# Patient Record
Sex: Female | Born: 2000 | Race: White | Hispanic: No | Smoking: Current every day smoker
Health system: Southern US, Community
[De-identification: ages and names within clinical notes are randomized; demographics above are authoritative.]

## PROBLEM LIST (undated history)

## (undated) DIAGNOSIS — F419 Anxiety disorder, unspecified: Secondary | ICD-10-CM

## (undated) DIAGNOSIS — F329 Major depressive disorder, single episode, unspecified: Secondary | ICD-10-CM

## (undated) DIAGNOSIS — H547 Unspecified visual loss: Secondary | ICD-10-CM

## (undated) DIAGNOSIS — Z87898 Personal history of other specified conditions: Secondary | ICD-10-CM

## (undated) DIAGNOSIS — F319 Bipolar disorder, unspecified: Secondary | ICD-10-CM

## (undated) DIAGNOSIS — F32A Depression, unspecified: Secondary | ICD-10-CM

## (undated) DIAGNOSIS — J302 Other seasonal allergic rhinitis: Secondary | ICD-10-CM

## (undated) DIAGNOSIS — J45909 Unspecified asthma, uncomplicated: Secondary | ICD-10-CM

## (undated) HISTORY — PX: OTHER SURGICAL HISTORY: SHX169

## (undated) HISTORY — DX: Anxiety disorder, unspecified: F41.9

## (undated) HISTORY — DX: Unspecified asthma, uncomplicated: J45.909

## (undated) HISTORY — DX: Depression, unspecified: F32.A

## (undated) HISTORY — DX: Unspecified visual loss: H54.7

## (undated) HISTORY — DX: Personal history of other specified conditions: Z87.898

---
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
## 2019-02-06 ENCOUNTER — Emergency Department
Admission: EM | Admit: 2019-02-06 | Discharge: 2019-02-06 | Disposition: A | Discharge status: Home / Self Care | Payer: Self-pay | Attending: Emergency Medicine | Admitting: Emergency Medicine

## 2019-02-06 ENCOUNTER — Other Ambulatory Visit: Payer: Self-pay

## 2019-02-06 ENCOUNTER — Encounter: Payer: Self-pay | Admitting: Emergency Medicine

## 2019-02-06 DIAGNOSIS — R300 Dysuria: Secondary | ICD-10-CM | POA: Insufficient documentation

## 2019-02-06 DIAGNOSIS — N898 Other specified noninflammatory disorders of vagina: Secondary | ICD-10-CM | POA: Insufficient documentation

## 2019-02-06 DIAGNOSIS — Z202 Contact with and (suspected) exposure to infections with a predominantly sexual mode of transmission: Secondary | ICD-10-CM | POA: Insufficient documentation

## 2019-02-06 DIAGNOSIS — F17200 Nicotine dependence, unspecified, uncomplicated: Secondary | ICD-10-CM | POA: Insufficient documentation

## 2019-02-06 MED ORDER — METRONIDAZOLE 500 MG PO TABS: 500.0000 mg | ORAL_TABLET | Freq: Two times a day (BID) | ORAL | 0 refills | Status: DC

## 2019-02-06 MED ORDER — CEFTRIAXONE SODIUM 250 MG IJ SOLR
250.0000 mg | INTRAMUSCULAR | Status: DC
  Administered 2019-02-06: 250 mg via INTRAMUSCULAR
  Filled 2019-02-06: qty 250

## 2019-02-06 MED ORDER — FLUCONAZOLE 50 MG PO TABS
150.0000 mg | ORAL_TABLET | Freq: Once | ORAL | Status: AC
  Administered 2019-02-06: 150 mg via ORAL
  Filled 2019-02-06: qty 3

## 2019-02-06 MED ORDER — AZITHROMYCIN 500 MG PO TABS
1000.0000 mg | ORAL_TABLET | Freq: Once | ORAL | Status: AC
  Administered 2019-02-06: 1000 mg via ORAL
  Filled 2019-02-06: qty 2

## 2019-02-06 MED ORDER — LIDOCAINE HCL (PF) 1 % IJ SOLN
INTRAMUSCULAR | Status: AC
  Filled 2019-02-06: qty 5

## 2019-02-06 MED ORDER — LIDOCAINE HCL (PF) 1 % IJ SOLN: 1.0000 mL | Freq: Once | INTRAMUSCULAR | Status: DC

## 2019-02-06 NOTE — ED Provider Notes (Signed)
[facility] Emergency Department Provider Note       Time seen: ----------------------------------------- 9:04 PM on 02/06/2019 -----------------------------------------   I have reviewed the triage vital signs and the nursing notes.  HISTORY   Chief Complaint Exposure to STD    HPI Linda Chapman is a 18 y.o. female with a history of seasonal allergy and STD who presents to the ED for vaginal discharge with some dysuria and vaginal itching.  Patient is convinced that she has an STD as she has had one in the past.  She denies fevers, chills or other complaints.  Past Medical History:  Diagnosis Date  . Seasonal allergies     There are no active problems to display for this patient.   History reviewed. No pertinent surgical history.  Allergies Patient has no known allergies.  Social History Social History   Tobacco Use  . Smoking status: Current Every Day Smoker  . Smokeless tobacco: Never Used  Substance Use Topics  . Alcohol use: No    Frequency: Never  . Drug use: Not on file   Review of Systems Constitutional: Negative for fever. Cardiovascular: Negative for chest pain. Respiratory: Negative for shortness of breath. Gastrointestinal: Negative for abdominal pain, vomiting and diarrhea. Genitourinary: Positive for dysuria, vaginal discharge Musculoskeletal: Negative for back pain. Skin: Negative for rash.  All systems negative/normal/unremarkable except as stated in the HPI  ____________________________________________   PHYSICAL EXAM:  VITAL SIGNS: ED Triage Vitals [02/06/19 1919]  Enc Vitals Group     BP 106/67     Pulse Rate 91     Resp 18     Temp 98.2 F (36.8 C)     Temp Source Oral     SpO2 99 %     Weight 122 lb (55.3 kg)     Height 5\' 3"  (1.6 m)     Head Circumference      Peak Flow      Pain Score 0     Pain Loc      Pain Edu?      Excl. in GC?     Constitutional: Alert and oriented. Well appearing  and in no distress. Cardiovascular: Normal rate, regular rhythm. No murmurs, rubs, or gallops. Respiratory: Normal respiratory effort without tachypnea nor retractions. Breath sounds are clear and equal bilaterally. No wheezes/rales/rhonchi. Gastrointestinal: Soft and nontender. Normal bowel sounds Musculoskeletal: Nontender with normal range of motion in extremities. No lower extremity tenderness nor edema. Neurologic:  Normal speech and language. No gross focal neurologic deficits are appreciated.  Skin:  Skin is warm, dry and intact. No rash noted. Psychiatric: Mood and affect are normal. Speech and behavior are normal.  ____________________________________________  ED COURSE:  As part of my medical decision making, I reviewed the following data within the electronic MEDICAL RECORD NUMBER History obtained from family if available, nursing notes, old chart and ekg, as well as notes from prior ED visits. Patient presented for vaginal discharge, we will assess with labs and imaging as indicated at this time.   Procedures ____________________________________________   DIFFERENTIAL DIAGNOSIS   STD, bacterial vaginosis, vaginitis  FINAL ASSESSMENT AND PLAN  Vaginal discharge   Plan: The patient had presented for likely STD.  Patient opted for treatment without any further testing.  She was given Rocephin, Zithromax and Diflucan.  She will be discharged home with a prescription for Flagyl.   Ulice Dash, MD    Note: This note was generated in part or whole with voice  recognition software. Voice recognition is usually quite accurate but there are transcription errors that can and very often do occur. I apologize for any typographical errors that were not detected and corrected.     Emily Filbert, MD 02/06/19 2105

## 2019-02-06 NOTE — ED Triage Notes (Signed)
Pt presents to ED via POV with fiance who is also requesting STD screening. Pt c/o white vaginal discharge and dysuria and vaginal itching.

## 2019-02-24 DIAGNOSIS — F1510 Other stimulant abuse, uncomplicated: Secondary | ICD-10-CM | POA: Insufficient documentation

## 2019-02-24 DIAGNOSIS — F1110 Opioid abuse, uncomplicated: Secondary | ICD-10-CM | POA: Insufficient documentation

## 2019-02-24 DIAGNOSIS — F1410 Cocaine abuse, uncomplicated: Secondary | ICD-10-CM | POA: Insufficient documentation

## 2019-02-24 LAB — HM HIV SCREENING LAB: HM HIV Screening: NEGATIVE

## 2019-02-24 LAB — HM HEPATITIS C SCREENING LAB: HM Hepatitis Screen: NEGATIVE

## 2019-06-07 ENCOUNTER — Encounter: Payer: Self-pay | Admitting: Emergency Medicine

## 2019-06-07 ENCOUNTER — Emergency Department: Payer: 59

## 2019-06-07 ENCOUNTER — Other Ambulatory Visit: Payer: Self-pay

## 2019-06-07 DIAGNOSIS — S5012XA Contusion of left forearm, initial encounter: Secondary | ICD-10-CM | POA: Insufficient documentation

## 2019-06-07 DIAGNOSIS — Y929 Unspecified place or not applicable: Secondary | ICD-10-CM | POA: Diagnosis not present

## 2019-06-07 DIAGNOSIS — W01198A Fall on same level from slipping, tripping and stumbling with subsequent striking against other object, initial encounter: Secondary | ICD-10-CM | POA: Insufficient documentation

## 2019-06-07 DIAGNOSIS — F17200 Nicotine dependence, unspecified, uncomplicated: Secondary | ICD-10-CM | POA: Diagnosis not present

## 2019-06-07 DIAGNOSIS — Y990 Civilian activity done for income or pay: Secondary | ICD-10-CM | POA: Insufficient documentation

## 2019-06-07 DIAGNOSIS — Y9389 Activity, other specified: Secondary | ICD-10-CM | POA: Diagnosis not present

## 2019-06-07 DIAGNOSIS — S59912A Unspecified injury of left forearm, initial encounter: Secondary | ICD-10-CM | POA: Diagnosis present

## 2019-06-07 NOTE — ED Triage Notes (Addendum)
Patient ambulatory to triage with steady gait, without difficulty or distress noted, mask in place; pt reports falling at work, today injuring left upper arm--c/o pain & swelling; st nexplanon removed earlier today; pt declines desire to file workers comp at this time

## 2019-06-08 ENCOUNTER — Emergency Department
Admission: EM | Admit: 2019-06-08 | Discharge: 2019-06-08 | Disposition: A | Discharge status: Home / Self Care | Payer: 59 | Attending: Emergency Medicine | Admitting: Emergency Medicine

## 2019-06-08 DIAGNOSIS — S40022A Contusion of left upper arm, initial encounter: Secondary | ICD-10-CM

## 2019-06-08 DIAGNOSIS — T1490XA Injury, unspecified, initial encounter: Secondary | ICD-10-CM

## 2019-06-08 NOTE — ED Provider Notes (Signed)
[facility] Emergency Department Provider Note  ____________________________________________   First MD Initiated Contact with Patient 06/08/19 0111     (approximate)  I have reviewed the triage vital signs and the nursing notes.   HISTORY  Chief Complaint Arm Injury    HPI Linda Chapman is a 18 y.o. female who presents for evaluation of pain in her left arm specifically on the inside of the elbow after a fall at work.  She had her Nexplanon removed from the same arm earlier in the day and then tripped over something while she was pushing some crates at work.  When she fell she hit the inside of her left arm (antecubital fossa) on the boxes.  She has some numbness in her hand and some swelling and bruising in the crook of her left elbow.  The pain is moderate and worse when she tries to flex and extend but there is no tenderness to the backs of the elbow and the swelling is relatively minimal.  She said that she has issues in both of her arms that if she flexes her elbows too tightly than she gets numbness in her hands so this is not altogether unexpected.  The Nexplanon wound did not reopen and she has had no bleeding.  She did not strike her head, did not lose consciousness, and has no headache nor neck pain.         Past Medical History:  Diagnosis Date  . Seasonal allergies     There are no active problems to display for this patient.   History reviewed. No pertinent surgical history.  Prior to Admission medications   Medication Sig Start Date End Date Taking? Authorizing Provider  cephALEXin (KEFLEX) 500 MG capsule Take 1 capsule (500 mg total) by mouth 2 (two) times daily. 11/12/17   Lurene ShadowPhelps, Erin O, PA-C  etonogestrel (NEXPLANON) 68 MG IMPL implant 1 each by Subdermal route once.    [provider]  metroNIDAZOLE (FLAGYL) 500 MG tablet Take 1 tablet (500 mg total) by mouth 3 (three) times daily. 09/18/18   Fisher, Roselyn BeringSusan W, PA-C   metroNIDAZOLE (FLAGYL) 500 MG tablet Take 1 tablet (500 mg total) by mouth 2 (two) times daily. 02/06/19   Emily FilbertWilliams, Jonathan E, MD    Allergies Patient has no known allergies.  Family History  Problem Relation Age of Onset  . Asthma Mother     Social History Social History   Tobacco Use  . Smoking status: Current Every Day Smoker  . Smokeless tobacco: Never Used  Substance Use Topics  . Alcohol use: No    Frequency: Never  . Drug use: Not on file    Review of Systems Constitutional: No fever/chills Respiratory: Denies shortness of breath. Gastrointestinal: No abdominal pain.  No nausea, no vomiting.   Musculoskeletal: Pain in inside of left elbow as described above.  Negative for neck pain.  Negative for back pain. Integumentary: Negative for rash. Neurological: Negative for headaches, focal weakness or numbness.   ____________________________________________   PHYSICAL EXAM:  VITAL SIGNS: ED Triage Vitals  Enc Vitals Group     BP 06/07/19 2132 (!) 132/100     Pulse Rate 06/07/19 2132 90     Resp 06/07/19 2132 18     Temp 06/07/19 2132 98.3 F (36.8 C)     Temp Source 06/07/19 2132 Oral     SpO2 06/07/19 2132 99 %     Weight 06/07/19 2135 55.8 kg (123 lb)  Height 06/07/19 2135 1.6 m (5\' 3" )     Head Circumference --      Peak Flow --      Pain Score 06/07/19 2132 8     Pain Loc --      Pain Edu? --      Excl. in [place]? --     Constitutional: Alert and oriented. Well appearing and in no acute distress. Eyes: Conjunctivae are normal.  Head: Atraumatic. Cardiovascular: Normal rate, regular rhythm. Good peripheral circulation.  Respiratory: Normal respiratory effort.  No retractions. No audible wheezing. Musculoskeletal: Patient has a bandage that she was told to leave in place for 24 hours at the site of the Nexplanon removal.  There is no obvious bleeding.  Compartments in the left arm are soft and easily compressible.  She has a contusion with some  ecchymosis in the left antecubital fossa consistent with a fall and contusion as she described.  There is some tenderness to palpation and a little bit of tenderness with flexion extension.  Neurovascularly intact distally although the patient does report some subjective sensory deficit (tingling) but it does not seem to be in a particular nerve distribution and she has a good and easily palpable radial pulse and good capillary refill.  No sign of compartment syndrome or other acute or emergent orthopedic or vascular condition. Neurologic:  Normal speech and language. No gross focal neurologic deficits are appreciated.  Skin:  Skin is warm, dry and intact. No rash noted.  Ecchymosis on left antecubital fossa as described above. Psychiatric: Mood and affect are normal. Speech and behavior are normal.  ____________________________________________   LABS (all labs ordered are listed, but only abnormal results are displayed)  Labs Reviewed - No data to display ____________________________________________  EKG  No indication for EKG ____________________________________________  RADIOLOGY I, Hinda Kehr, personally viewed and evaluated these images (plain radiographs) as part of my medical decision making, as well as reviewing the written report by the radiologist.  ED MD interpretation: No evidence of fracture nor dislocation  Official radiology report(s): Dg Humerus Left  Result Date: 06/07/2019 CLINICAL DATA:  Fall, left arm injury EXAM: LEFT HUMERUS - 2+ VIEW COMPARISON:  None. FINDINGS: There is no evidence of fracture or other focal bone lesions. Soft tissues are unremarkable. IMPRESSION: Negative. Electronically Signed   By: Rolm Baptise M.D.   On: 06/07/2019 22:11    ____________________________________________   PROCEDURES   Procedure(s) performed (including Critical Care):  Procedures   ____________________________________________   INITIAL IMPRESSION / MDM /  [place] / ED COURSE  As part of my medical decision making, I reviewed the following data within the [place] notes reviewed and incorporated, Old chart reviewed, Radiograph reviewed , Notes from prior ED visits and [place] Controlled Substance Database      *Alexias Margerum was evaluated in Emergency Department on 06/08/2019 for the symptoms described in the history of present illness. She was evaluated in the context of the global COVID-19 pandemic, which necessitated consideration that the patient might be at risk for infection with the SARS-CoV-2 virus that causes COVID-19. Institutional protocols and algorithms that pertain to the evaluation of patients at risk for COVID-19 are in a state of rapid change based on information released by regulatory bodies including the CDC and federal and state organizations. These policies and algorithms were followed during the patient's care in the ED.  Some ED evaluations and interventions may be delayed as a result of limited staffing  during the pandemic.*  Soft tissue contusion of the left antecubital fossa.  Her physical findings are consistent with the story she describes, where she fell forward and struck the inside of her left elbow on a crate.  No evidence of compartment syndrome or other acute or emergent medical condition.  I am providing a sling and a relatively loose ACE wrap and went over RICE recommendations.  I gave my usual and customary return precautions and she understands and agrees with the plan.      ____________________________________________  FINAL CLINICAL IMPRESSION(S) / ED DIAGNOSES  Final diagnoses:  Arm contusion, left, initial encounter     MEDICATIONS GIVEN DURING THIS VISIT:  Medications - No data to display   ED Discharge Orders    None       Note:  This document was prepared using Dragon voice recognition software and may include unintentional dictation errors.   Loleta RoseForbach,  Natasia Sanko, MD 06/08/19 [phone]

## 2019-08-01 ENCOUNTER — Other Ambulatory Visit: Payer: Self-pay

## 2019-08-01 ENCOUNTER — Emergency Department
Admission: EM | Admit: 2019-08-01 | Discharge: 2019-08-01 | Disposition: A | Discharge status: Home / Self Care | Payer: 59 | Attending: Emergency Medicine | Admitting: Emergency Medicine

## 2019-08-01 DIAGNOSIS — F17210 Nicotine dependence, cigarettes, uncomplicated: Secondary | ICD-10-CM | POA: Insufficient documentation

## 2019-08-01 DIAGNOSIS — Z23 Encounter for immunization: Secondary | ICD-10-CM | POA: Diagnosis not present

## 2019-08-01 DIAGNOSIS — Y9389 Activity, other specified: Secondary | ICD-10-CM | POA: Diagnosis not present

## 2019-08-01 DIAGNOSIS — Y998 Other external cause status: Secondary | ICD-10-CM | POA: Insufficient documentation

## 2019-08-01 DIAGNOSIS — S50812A Abrasion of left forearm, initial encounter: Secondary | ICD-10-CM | POA: Diagnosis not present

## 2019-08-01 DIAGNOSIS — Y9289 Other specified places as the place of occurrence of the external cause: Secondary | ICD-10-CM | POA: Insufficient documentation

## 2019-08-01 MED ORDER — TETANUS-DIPHTH-ACELL PERTUSSIS 5-2.5-18.5 LF-MCG/0.5 IM SUSP
0.5000 mL | Freq: Once | INTRAMUSCULAR | Status: AC
  Administered 2019-08-01: 0.5 mL via INTRAMUSCULAR
  Filled 2019-08-01: qty 0.5
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
## 2019-08-12 ENCOUNTER — Other Ambulatory Visit: Payer: Self-pay

## 2019-08-12 ENCOUNTER — Encounter: Payer: Self-pay | Admitting: Emergency Medicine

## 2019-08-12 ENCOUNTER — Emergency Department (EMERGENCY_DEPARTMENT_HOSPITAL)
Admission: EM | Admit: 2019-08-12 | Discharge: 2019-08-14 | Disposition: A | Discharge status: Other Acute Inpatient Hospital | Payer: 59 | Source: Home / Self Care | Attending: Student in an Organized Health Care Education/Training Program | Admitting: Student in an Organized Health Care Education/Training Program

## 2019-08-12 DIAGNOSIS — R45851 Suicidal ideations: Secondary | ICD-10-CM

## 2019-08-12 DIAGNOSIS — Z20828 Contact with and (suspected) exposure to other viral communicable diseases: Secondary | ICD-10-CM | POA: Insufficient documentation

## 2019-08-12 DIAGNOSIS — F319 Bipolar disorder, unspecified: Secondary | ICD-10-CM | POA: Insufficient documentation

## 2019-08-12 DIAGNOSIS — F332 Major depressive disorder, recurrent severe without psychotic features: Secondary | ICD-10-CM | POA: Diagnosis present

## 2019-08-12 DIAGNOSIS — T50902A Poisoning by unspecified drugs, medicaments and biological substances, intentional self-harm, initial encounter: Secondary | ICD-10-CM

## 2019-08-12 DIAGNOSIS — F17200 Nicotine dependence, unspecified, uncomplicated: Secondary | ICD-10-CM | POA: Insufficient documentation

## 2019-08-12 HISTORY — DX: Bipolar disorder, unspecified: F31.9

## 2019-08-12 LAB — URINALYSIS, COMPLETE (UACMP) WITH MICROSCOPIC
Bacteria, UA: NONE SEEN
Bilirubin Urine: NEGATIVE
Glucose, UA: NEGATIVE mg/dL
Hgb urine dipstick: NEGATIVE
Ketones, ur: NEGATIVE mg/dL
Leukocytes,Ua: NEGATIVE
Nitrite: NEGATIVE
Protein, ur: NEGATIVE mg/dL
Specific Gravity, Urine: 1.014 (ref 1.005–1.030)
WBC, UA: NONE SEEN WBC/hpf (ref 0–5)
pH: 6 (ref 5.0–8.0)

## 2019-08-12 LAB — COMPREHENSIVE METABOLIC PANEL
ALT: 15 U/L (ref 0–44)
AST: 17 U/L (ref 15–41)
Albumin: 4.6 g/dL (ref 3.5–5.0)
Alkaline Phosphatase: 64 U/L (ref 38–126)
Anion gap: 8 (ref 5–15)
BUN: 19 mg/dL (ref 6–20)
CO2: 23 mmol/L (ref 22–32)
Calcium: 9.3 mg/dL (ref 8.9–10.3)
Chloride: 109 mmol/L (ref 98–111)
Creatinine, Ser: 0.67 mg/dL (ref 0.44–1.00)
GFR calc Af Amer: >60 mL/min (ref >60)
GFR calc non Af Amer: >60 mL/min (ref >60)
Glucose, Bld: 111 mg/dL — ABNORMAL HIGH (ref 70–99)
Potassium: 3.4 mmol/L — ABNORMAL LOW (ref 3.5–5.1)
Sodium: 140 mmol/L (ref 135–145)
Total Bilirubin: 0.5 mg/dL (ref 0.3–1.2)
Total Protein: 7.5 g/dL (ref 6.5–8.1)

## 2019-08-12 LAB — CBC WITH DIFFERENTIAL/PLATELET
Abs Immature Granulocytes: 0.04 10*3/uL (ref 0.00–0.07)
Basophils Absolute: 0 10*3/uL (ref 0.0–0.1)
Basophils Relative: 0 %
Eosinophils Absolute: 0.1 10*3/uL (ref 0.0–0.5)
Eosinophils Relative: 1 %
HCT: 40.6 % (ref 36.0–46.0)
Hemoglobin: 13.9 g/dL (ref 12.0–15.0)
Immature Granulocytes: 0 %
Lymphocytes Relative: 17 %
Lymphs Abs: 1.9 10*3/uL (ref 0.7–4.0)
MCH: 31.6 pg (ref 26.0–34.0)
MCHC: 34.2 g/dL (ref 30.0–36.0)
MCV: 92.3 fL (ref 80.0–100.0)
Monocytes Absolute: 0.8 10*3/uL (ref 0.1–1.0)
Monocytes Relative: 7 %
Neutro Abs: 8.5 10*3/uL — ABNORMAL HIGH (ref 1.7–7.7)
Neutrophils Relative %: 75 %
Platelets: 254 10*3/uL (ref 150–400)
RBC: 4.4 MIL/uL (ref 3.87–5.11)
RDW: 11.7 % (ref 11.5–15.5)
WBC: 11.4 10*3/uL — ABNORMAL HIGH (ref 4.0–10.5)
nRBC: 0 % (ref 0.0–0.2)

## 2019-08-12 LAB — ACETAMINOPHEN LEVEL: Acetaminophen (Tylenol), Serum: <10 ug/mL — ABNORMAL LOW (ref 10–30)

## 2019-08-12 LAB — SALICYLATE LEVEL: Salicylate Lvl: <7 mg/dL (ref 2.8–30.0)

## 2019-08-12 LAB — POCT PREGNANCY, URINE: Preg Test, Ur: NEGATIVE

## 2019-08-12 LAB — HCG, QUANTITATIVE, PREGNANCY: hCG, Beta Chain, Quant, S: <1 m[IU]/mL (ref <5)

## 2019-08-12 MED ORDER — BACITRACIN ZINC 500 UNIT/GM EX OINT
TOPICAL_OINTMENT | Freq: Two times a day (BID) | CUTANEOUS | Status: DC
  Administered 2019-08-12: 20:00:00 via TOPICAL
  Filled 2019-08-12: qty 0.9

## 2019-08-12 NOTE — ED Notes (Signed)
Pt dressed out by this tech.  Blue underwear Blue jeans Black shirt Black socks Black shoes Blue Bra Black wallet Pink Cell Phone Black colorful belt Green Watch Navel ring 2 containers of pills
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
## 2019-08-12 NOTE — ED Notes (Signed)
Pt. Moved to 20 H from interview room.

## 2019-08-12 NOTE — ED Notes (Signed)
IVC pending consult   
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
## 2019-08-12 NOTE — ED Notes (Signed)
Pt. Talking to TTS in interview room.
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
## 2019-08-12 NOTE — ED Notes (Signed)
Pt's wound on her left forearm cleaned and bacitracin applied. The scratch is more of an abrasion and is about 6 inches long

## 2019-08-12 NOTE — ED Notes (Signed)
Pt states the bottles of prednisone and stool softners full. Pointed out the predisone was filled in September and only had 16 in the bottle. Pt unwilling to give mom's info to find out how many she took back in September.

## 2019-08-12 NOTE — ED Notes (Signed)
Pt. Introduced to unit.  Pt. Advised of cameras and safety checks.  Pt. Wanting to be woke up when Psychiatrist is available to due assessment.

## 2019-08-12 NOTE — ED Notes (Signed)
Attempted to call mother Arne Cleveland [phone]. No voicemail available, just rings with no answer.

## 2019-08-12 NOTE — ED Notes (Addendum)
Poison control -  Confirmed pt will only need supportive care. Possible gi upset from prednisone and possible diarrhea from stool softner. Repeat tylenol at 2145

## 2019-08-13 DIAGNOSIS — F332 Major depressive disorder, recurrent severe without psychotic features: Secondary | ICD-10-CM | POA: Diagnosis present

## 2019-08-13 LAB — URINE DRUG SCREEN, QUALITATIVE (ARMC ONLY)
Amphetamines, Ur Screen: NOT DETECTED
Barbiturates, Ur Screen: NOT DETECTED
Benzodiazepine, Ur Scrn: NOT DETECTED
Cannabinoid 50 Ng, Ur ~~LOC~~: POSITIVE — AB
Cocaine Metabolite,Ur ~~LOC~~: NOT DETECTED
MDMA (Ecstasy)Ur Screen: NOT DETECTED
Methadone Scn, Ur: NOT DETECTED
Opiate, Ur Screen: NOT DETECTED
Phencyclidine (PCP) Ur S: NOT DETECTED
Tricyclic, Ur Screen: NOT DETECTED

## 2019-08-13 LAB — SARS CORONAVIRUS 2 BY RT PCR (HOSPITAL ORDER, PERFORMED IN ~~LOC~~ HOSPITAL LAB): SARS Coronavirus 2: NEGATIVE

## 2019-08-13 NOTE — BH Assessment (Addendum)
Update 14:30 Patient will continue with previous placement at [facility] on Monday 08/14/2019 as there is no transportation available to [place] on 08/13/2019.   -- 13:59 [facility] is willing to accept the patient for today if transportation can be arranged.   Patient has been accepted to [facility].  Patient room assignment will take place upon arrival. Accepting physician is Dr. Hyman Bower.  Call report to [phone].  Representative was [employer].   ER Staff is aware of it:  LuAnn, ER Secretary  Dr. Quentin Cornwall, ER MD  Amy, Patient's Nurse  If no transportation can be arranged, then patient will continue with previous placement for Monday morning transportation to [facility].

## 2019-08-13 NOTE — ED Notes (Signed)
Hourly rounding reveals patient in day room. No complaints, stable, in no acute distress. Q15 minute rounds and monitoring via Security Cameras to continue. 

## 2019-08-13 NOTE — ED Notes (Signed)
Pt given urine cup to provide sample for UDS.

## 2019-08-13 NOTE — BH Assessment (Signed)
Patient has been accepted to [facility].  Patient will receive room assignment upon arrival. Accepting physician is Dr. Drinda Butts.  Call report to [phone].  Representative was [employer].   ER Staff is aware of it:  Ronnie, ER Secretary  Dr. Quentin Cornwall, ER MD  Amy, Patient's Nurse

## 2019-08-13 NOTE — ED Notes (Signed)
No transport available today as they are in [place] and not back in time

## 2019-08-13 NOTE — BH Assessment (Addendum)
Ebony Hail called from [facility] with willingness to accept patient to the young adult program for tomorrow at 9am. Accepting provider is Dr. Jonelle Sports. Call report to [phone]. [facility] [place], [place].  TTS explained to Ebony Hail that if another hospital is able to accept patient for today, then [facility] will accept the placement for today rather than wait for this placement tomorrow.  Ebony Hail expressed understanding.

## 2019-08-13 NOTE — BH Assessment (Signed)
[facility] ED Referral Information was faxed to the following:   [place]  [place]

## 2019-08-13 NOTE — ED Notes (Signed)
Poison control called back to check on patient, information given.

## 2019-08-13 NOTE — ED Notes (Signed)
SOC called, report given.  Pt. Ready.  Pt. Talking to [facility]

## 2019-08-13 NOTE — ED Notes (Signed)
Pt. Completed SOC.  Pt. Initially upset about SOC's recommendation to admit.  Spoke with patient after [facility] for awhile about possible benefits to inpatient services.  Pt. Became more receptive to inpatient, but is scared about the possibility of losing job.  Pt. Told she would be given work excuse from hospital.  Pt. Calm and more upbeat after conversation.

## 2019-08-13 NOTE — ED Notes (Signed)
Report to include Situation, Background, Assessment, and Recommendations received from Amy B. RN. Patient alert and oriented, warm and dry, in no acute distress. Patient denies SI, HI, AVH and pain. Patient made aware of Q15 minute rounds and security cameras for their safety. Patient instructed to come to me with needs or concerns. 
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
## 2019-08-14 ENCOUNTER — Encounter: Payer: Self-pay | Admitting: Psychiatry

## 2019-08-14 ENCOUNTER — Other Ambulatory Visit: Payer: Self-pay

## 2019-08-14 ENCOUNTER — Inpatient Hospital Stay
Admission: RE | Admit: 2019-08-14 | Discharge: 2019-08-18 | DRG: 885 | Disposition: A | Discharge status: Home / Self Care | Payer: 59 | Source: Intra-hospital | Attending: Psychiatry | Admitting: Psychiatry

## 2019-08-14 DIAGNOSIS — F3160 Bipolar disorder, current episode mixed, unspecified: Principal | ICD-10-CM

## 2019-08-14 DIAGNOSIS — F1210 Cannabis abuse, uncomplicated: Secondary | ICD-10-CM

## 2019-08-14 DIAGNOSIS — Z20828 Contact with and (suspected) exposure to other viral communicable diseases: Secondary | ICD-10-CM | POA: Diagnosis present

## 2019-08-14 DIAGNOSIS — F332 Major depressive disorder, recurrent severe without psychotic features: Secondary | ICD-10-CM | POA: Diagnosis not present

## 2019-08-14 DIAGNOSIS — T1491XA Suicide attempt, initial encounter: Secondary | ICD-10-CM

## 2019-08-14 DIAGNOSIS — F17210 Nicotine dependence, cigarettes, uncomplicated: Secondary | ICD-10-CM | POA: Diagnosis present

## 2019-08-14 DIAGNOSIS — F322 Major depressive disorder, single episode, severe without psychotic features: Secondary | ICD-10-CM | POA: Insufficient documentation

## 2019-08-14 MED ORDER — MAGNESIUM HYDROXIDE 400 MG/5ML PO SUSP: 30.0000 mL | Freq: Every day | ORAL | Status: DC | PRN

## 2019-08-14 MED ORDER — ALUM & MAG HYDROXIDE-SIMETH 200-200-20 MG/5ML PO SUSP: 30.0000 mL | ORAL | Status: DC | PRN

## 2019-08-14 MED ORDER — ACETAMINOPHEN 325 MG PO TABS: 650.0000 mg | ORAL_TABLET | Freq: Four times a day (QID) | ORAL | Status: DC | PRN

## 2019-08-14 NOTE — ED Notes (Signed)
Hourly rounding reveals patient sleeping in room. No complaints, stable, in no acute distress. Q15 minute rounds and monitoring via Security Cameras to continue. 

## 2019-08-14 NOTE — ED Notes (Signed)
Breakfast tray with drink was given. 

## 2019-08-14 NOTE — Plan of Care (Addendum)
Newly admitted. Adjusting well to the unit. In the milieu, appropriate and cooperative. No self harm behaviors.

## 2019-08-14 NOTE — BH Assessment (Signed)
Writer spoke with [facility] ([phone]) and he stated he didn't have information about the patient being accepted to their facility.   Writer made several attempts to speak with admission staff at [facility] [phone]) but was unable to reach anyone.  Writer spoke with Psych Nurse Practitioner, Waylan Boga, and patient will be admitted to [facility].  Writer updated ER Secretary Lattie Haw).

## 2019-08-14 NOTE — ED Notes (Signed)
Gave lunch tray with juice. 

## 2019-08-14 NOTE — Tx Team (Signed)
Initial Treatment Plan 08/14/2019 6:52 PM Linda Chapman QBV:[id number]    PATIENT STRESSORS: Loss of marriage (currently separated from her husband) Marital or family conflict Substance abuse   PATIENT STRENGTHS: Ability for insight Active sense of humor Average or above average intelligence Capable of independent living Communication skills Physical Health   PATIENT IDENTIFIED PROBLEMS: Depression   Anxiety  Substance use                 DISCHARGE CRITERIA:  Ability to meet basic life and health needs Improved stabilization in mood, thinking, and/or behavior Motivation to continue treatment in a less acute level of care Verbal commitment to aftercare and medication compliance  PRELIMINARY DISCHARGE PLAN: Outpatient therapy Participate in family therapy Return to previous living arrangement  PATIENT/FAMILY INVOLVEMENT: This treatment plan has been presented to and reviewed with the patient, Linda Chapman.  The patient has been given the opportunity to ask questions and make suggestions.  Ronelle Nigh, RN 08/14/2019, 6:52 PM

## 2019-08-14 NOTE — ED Notes (Signed)
BEHAVIORAL HEALTH ROUNDING Patient sleeping: No. Patient alert and oriented: yes Behavior appropriate: Yes.  ; If no, describe:  Nutrition and fluids offered: yes Toileting and hygiene offered: Yes  Sitter present: q15 minute observations and security camera monitoring Law enforcement present: Yes   ENVIRONMENTAL ASSESSMENT Potentially harmful objects out of patient reach: Yes.   Personal belongings secured: Yes.   Patient dressed in hospital provided attire only: Yes.   Plastic bags out of patient reach: Yes.   Patient care equipment (cords, cables, call bells, lines, and drains) shortened, removed, or accounted for: Yes.   Equipment and supplies removed from bottom of stretcher: Yes.   Potentially toxic materials out of patient reach: Yes.   Sharps container removed or out of patient reach: Yes.    

## 2019-08-14 NOTE — ED Notes (Signed)
Hourly rounding reveals patient in room. No complaints, stable, in no acute distress. Q15 minute rounds and monitoring via Security Cameras to continue. 

## 2019-08-14 NOTE — ED Notes (Signed)
Patient observed lying in bed with eyes closed  Even, unlabored respirations observed   NAD pt appears to be sleeping  I will continue to monitor along with every 15 minute visual observations and ongoing security camera monitoring    

## 2019-08-14 NOTE — ED Notes (Signed)
BEHAVIORAL HEALTH ROUNDING Patient sleeping: No. Patient alert and oriented: yes Behavior appropriate: Yes.  ; If no, describe:  Nutrition and fluids offered: yes Toileting and hygiene offered: Yes  Sitter present: q15 minute observations and security camera monitoring   

## 2019-08-14 NOTE — ED Notes (Signed)
ED BHU [place] Is the patient under IVC or is there intent for IVC: Yes.   Is the patient medically cleared: Yes.   Is there vacancy in the ED BHU: Yes.   Is the population mix appropriate for patient: Yes.   Is the patient awaiting placement in inpatient or outpatient setting: Yes.   Has the patient had a psychiatric consult: Yes.   Survey of unit performed for contraband, proper placement and condition of furniture, tampering with fixtures in bathroom, shower, and each patient room: Yes.  ; Findings:  APPEARANCE/BEHAVIOR Calm and cooperative NEURO ASSESSMENT Orientation: oriented x3  Denies pain Hallucinations: No.None noted (Hallucinations)  Denies  Speech: Normal Gait: normal RESPIRATORY ASSESSMENT Even  Unlabored respirations  CARDIOVASCULAR ASSESSMENT Pulses equal   regular rate  Skin warm and dry   GASTROINTESTINAL ASSESSMENT no GI complaint EXTREMITIES Full ROM  PLAN OF CARE Provide calm/safe environment. Vital signs assessed twice daily. ED BHU Assessment once each 12-hour shift. Collaborate with TTS daily or as condition indicates. Assure the ED provider has rounded once each shift. Provide and encourage hygiene. Provide redirection as needed. Assess for escalating behavior; address immediately and inform ED provider.  Assess family dynamic and appropriateness for visitation as needed: Yes.  ; If necessary, describe findings:  Educate the patient/family about BHU procedures/visitation: Yes.  ; If necessary, describe findings:

## 2019-08-14 NOTE — BH Assessment (Signed)
Patient is to be admitted to [facility] by Psychiatric Nurse Practitioner Waylan Boga.  Attending Physician will be Dr. Weber Cooks.   Patient has been assigned to room 303, by [facility] Charge Nurse Demetria.   ER staff is aware of the admission:  Lattie Haw, ER Secretary    Dr. Charna Archer, ER MD   Amy T., Patient's Nurse   Gust Rung., Patient Access.

## 2019-08-14 NOTE — Progress Notes (Addendum)
Patient ID: Linda Chapman, female   DOB: Dec 10, 2001, 18 y.o.   MRN: [id number] Patient presents involuntarily secondary to increased depression, anxiety and suicidal thoughts that led to self harm behaviors. Reports that she got into an argument with her fiancee, became overwhelmed and started scratching her left forearm  "to ease my mental pain". Patient reports that she did not intend to kill herself. However, she also reports that she took an overdose on her mother's medications (Predindsone and laxatives, number unknown). Patient's fiancee became concerned and called for help.  Patient reports history of mental illnesses, notably Bipolar I disorder, MDD and Anxiety disorder. Reports that she has not been able to get treatment because her parents were going through divorce. Patient reports that she is going through divorce from her previous husband who is a 18 Year old female. She was married at age 74. She is a daily smoker of Marijuana and Cigarette. Reports that she is not ready to quit. Denies history of abuse or neglect. Denies pain. Pleasant and cooperative upon approach. Skin assessment performed by this Probation officer, staffed with nurse Demetria. Patient has scratched on her left forearm, related to self injurious behaviors. No sign of infection noted.  Admitted and oriented to the unit. Safety precautions initiated. To be evaluated by attending MD in AM.

## 2019-08-14 NOTE — ED Notes (Signed)
BEHAVIORAL HEALTH ROUNDING Patient sleeping: Yes.   Patient alert and oriented: eyes closed  Appears asleep Behavior appropriate: Yes.  ; If no, describe:  Nutrition and fluids offered: Yes  Toileting and hygiene offered: sleeping Sitter present: q 15 minute observations and security camera monitoring  

## 2019-08-14 NOTE — ED Provider Notes (Signed)
-----------------------------------------   3:02 AM on 08/14/2019 -----------------------------------------   Blood pressure (!) 97/54, pulse 81, temperature 98.1 F (36.7 C), temperature source Oral, resp. rate 16, height 5\' 3"  (1.6 m), weight 54.4 kg, last menstrual period 07/29/2019, SpO2 100 %.  The patient is calm and cooperative at this time.  There have been no acute events since the last update.  Awaiting disposition plan from Behavioral Medicine and/or Social Work team(s).    Delman Kitten, MD 08/14/19 [phone]

## 2019-08-14 NOTE — ED Notes (Signed)
BEHAVIORAL HEALTH ROUNDING Patient sleeping: No. Patient alert and oriented: yes Behavior appropriate: Yes.  ; If no, describe:  Nutrition and fluids offered: yes Toileting and hygiene offered: Yes  Sitter present: q15 minute observations and security camera monitoring Law enforcement present: Yes

## 2019-08-15 DIAGNOSIS — F3160 Bipolar disorder, current episode mixed, unspecified: Principal | ICD-10-CM

## 2019-08-15 DIAGNOSIS — F1210 Cannabis abuse, uncomplicated: Secondary | ICD-10-CM

## 2019-08-15 DIAGNOSIS — T1491XA Suicide attempt, initial encounter: Secondary | ICD-10-CM

## 2019-08-15 MED ORDER — QUETIAPINE FUMARATE 100 MG PO TABS
100.0000 mg | ORAL_TABLET | Freq: Every day | ORAL | Status: DC
  Administered 2019-08-15: 21:00:00 100 mg via ORAL
  Filled 2019-08-15: qty 1

## 2019-08-15 NOTE — [facility] Suicide Risk Assessment (Signed)
[facility] Admission Suicide Risk Assessment   Nursing information obtained from:  Patient Demographic factors:  Caucasian Current Mental Status:  NA Loss Factors:  NA Historical Factors:  NA Risk Reduction Factors:  Positive social support, Living with another person, especially a relative  Total Time spent with patient: 1 hour Principal Problem: Mixed bipolar I disorder ([place]) Diagnosis:  Principal Problem:   Mixed bipolar I disorder ([place]) Active Problems:   Cannabis abuse   Suicide attempt ([place])  Subjective Data: Patient seen chart reviewed.  Patient presented to the emergency room after an impulsive overdose with nonlethal medication.  Denies having had a clear-cut major depression recently but does have serious chronic problems with mood instability.  Not evidently psychotic.  Denies any current suicidal ideation.  Admits to daily marijuana use denies other drug use.  Patient is agreeable to treatment planning  Continued Clinical Symptoms:  Alcohol Use Disorder Identification Test Final Score (AUDIT): 2 The "Alcohol Use Disorders Identification Test", Guidelines for Use in Primary Care, Second Edition.  World Pharmacologist [facility]). Score between 0-7:  no or low risk or alcohol related problems. Score between 8-15:  moderate risk of alcohol related problems. Score between 16-19:  high risk of alcohol related problems. Score 20 or above:  warrants further diagnostic evaluation for alcohol dependence and treatment.   CLINICAL FACTORS:   Bipolar Disorder:   Mixed State Depression:   Impulsivity   Musculoskeletal: Strength & Muscle Tone: within normal limits Gait & Station: normal Patient leans: N/A  Psychiatric Specialty Exam: Physical Exam  Nursing note and vitals reviewed. Constitutional: She appears well-developed and well-nourished.  HENT:  Head: Normocephalic and atraumatic.  Eyes: Pupils are equal, round, and reactive to light. Conjunctivae are normal.  Neck: Normal  range of motion.  Cardiovascular: Regular rhythm and normal heart sounds.  Respiratory: Effort normal.  GI: Soft.  Musculoskeletal: Normal range of motion.  Neurological: She is alert.  Skin: Skin is warm and dry.  Psychiatric: Her mood appears anxious. Her affect is labile and inappropriate. Her speech is rapid and/or pressured. She is agitated and hyperactive. She is not aggressive and not combative. Cognition and memory are impaired. She expresses impulsivity. She expresses no homicidal and no suicidal ideation.    Review of Systems  Constitutional: Negative.   HENT: Negative.   Eyes: Negative.   Respiratory: Negative.   Cardiovascular: Negative.   Gastrointestinal: Negative.   Musculoskeletal: Negative.   Skin: Negative.   Neurological: Negative.   Psychiatric/Behavioral: Positive for depression, substance abuse and suicidal ideas. Negative for hallucinations and memory loss. The patient is nervous/anxious and has insomnia.     Blood pressure (!) 117/58, pulse 67, temperature 97.6 F (36.4 C), temperature source Oral, resp. rate 16, height 5\' 5"  (1.651 m), weight 53 kg, last menstrual period 07/29/2019, SpO2 100 %.Body mass index is 19.44 kg/m.  General Appearance: Casual  Eye Contact:  Good  Speech:  Pressured  Volume:  Increased  Mood:  Depressed and Euphoric  Affect:  Inappropriate and Labile  Thought Process:  Disorganized  Orientation:  Full (Time, Place, and Person)  Thought Content:  Illogical, Rumination and Tangential  Suicidal Thoughts:  No  Homicidal Thoughts:  No  Memory:  Immediate;   Fair Recent;   Fair Remote;   Fair  Judgement:  Impaired  Insight:  Fair  Psychomotor Activity:  Restlessness  Concentration:  Concentration: Poor  Recall:  [employer] of Knowledge:  Fair  Language:  Fair  Akathisia:  No  Handed:  Right  AIMS (if indicated):     Assets:  Desire for Improvement Financial Resources/Insurance Housing Physical Health Resilience Social  Support  ADL's:  Intact  Cognition:  WNL  Sleep:  Number of Hours: 3      COGNITIVE FEATURES THAT CONTRIBUTE TO RISK:  Loss of executive function    SUICIDE RISK:   Mild:  Suicidal ideation of limited frequency, intensity, duration, and specificity.  There are no identifiable plans, no associated intent, mild dysphoria and related symptoms, good self-control (both objective and subjective assessment), few other risk factors, and identifiable protective factors, including available and accessible social support.  PLAN OF CARE: Patient admitted after impulsive suicide attempt but was cooperative with coming to the hospital and with treatment.  Currently denies suicidal ideation.  Not delusional but showing dramatic mood lability and instability suggesting still some risk of possible suicidal ideation.  Continue appropriate treatment with medication.  15-minute checks.  Treatment team evaluation and groups and individual therapy  I certify that inpatient services furnished can reasonably be expected to improve the patient's condition.   Mordecai RasmussenJohn Clapacs, MD 08/15/2019, 2:32 PM
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
## 2019-08-15 NOTE — Progress Notes (Signed)
D: Pt alert and oriented. Pt affect/mood appears as anxious/depressed.  Pt rates depression 0/10, hopelessness 0/10, and anxiety 4/10.Pt goal: "stay focused on my happiness, stay away from my depression and anxiety". Pt reports energy level as normal and concentration as being good. Pt reports sleep last night as being good. Pt did not receive medications for sleep. Pt denies experiencing  pain. Pt denies experiencing any SI/HI, or AVH at this time.   Pt reports experiencing separation anxiety r/t being away from her dog and fiance. Developing new coping skills and identifying triggers were discussed with the pt. Pt states that she has a difficult time identifying her triggers and just tries to stay busy working. Pt also states that she is afraid and weird-ed out by another pt on the unit and doesn't know what she did to upset the other pt. This pt states that she isn't sure but thinks she maybe out to get her.  Pt has been observed laughing and smiling with peers. Pt seem to have formed an attachment/particularly close bond with a specific pt on the hall.  A: Support and encouragement provided. Frequent verbal contact made. Routine safety checks conducted q15 minutes.   R: Pt verbally contracts for safety at this time. Pt complaint with  treatment plan. Pt interacts well with others on the unit. Pt remains safe at this time. Will continue to monitor.

## 2019-08-15 NOTE — Progress Notes (Signed)
Patient alert and oriented x 4, affect is blunted, mood is labile she denies SI/HI/AVH, appears restless noted walking back and forth on the unit very intrusive with multiple request at a time. Patient's thoughts are disorganized, speech is pressured, and tangential at times, she was able to follow simple commands and receptive to staff.  Patient is complaint with medication regimen, she is interacting appropriately with peers and staff no distress noted. 15 minutes safety checks maintained will continue to monitor.

## 2019-08-15 NOTE — Progress Notes (Signed)
[place] NOVEL CORONAVIRUS (COVID-19) DAILY CHECK-OFF SYMPTOMS - answer yes or no to each - every day NO YES  Have you had a fever in the past 24 hours?  . Fever (Temp > 37.80C / 100F) X   Have you had any of these symptoms in the past 24 hours? . New Cough .  Sore Throat  .  Shortness of Breath .  Difficulty Breathing .  Unexplained Body Aches   X   Have you had any one of these symptoms in the past 24 hours not related to allergies?   . Runny Nose .  Nasal Congestion .  Sneezing   X   If you have had runny nose, nasal congestion, sneezing in the past 24 hours, has it worsened?  X   EXPOSURES - check yes or no X   Have you traveled outside the state in the past 14 days?  X   Have you been in contact with someone with a confirmed diagnosis of COVID-19 or PUI in the past 14 days without wearing appropriate PPE?  X   Have you been living in the same home as a person with confirmed diagnosis of COVID-19 or a PUI (household contact)?    X   Have you been diagnosed with COVID-19?    X              What to do next: Answered NO to all: Answered YES to anything:   Proceed with unit schedule Follow the [facility] Inpatient Flowsheet.   

## 2019-08-15 NOTE — [facility] Suicide Risk Assessment (Signed)
[place] INPATIENT:  Family/Significant Other Suicide Prevention Education  Suicide Prevention Education:  Contact Attempts: Linda Chapman, fiance [phone] has been identified by the patient as the family member/significant other with whom the patient will be residing, and identified as the person(s) who will aid the patient in the event of a mental health crisis.  With written consent from the patient, two attempts were made to provide suicide prevention education, prior to and/or following the patient's discharge.  We were unsuccessful in providing suicide prevention education.  A suicide education pamphlet was given to the patient to share with family/significant other.  Date and time of first attempt: 08/15/19 at [place] left confidential VM Date and time of second attempt: 2nd attempt needed  Linda Chapman T Linda Chapman 08/15/2019, 11:01 AM
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
## 2019-08-15 NOTE — Progress Notes (Signed)
Recreation Therapy Notes  Date: 08/15/2019  Time: 9:30 am  Location: Craft room  Behavioral response: Appropriate   Intervention Topic: Goals   Discussion/Intervention:  Group content on today was focused on goals. Patients described what goals are and how they define goals. Individuals expressed how they go about setting goals and reaching them. The group identified how important goals are and if they make short term goals to reach long term goals. Patients described how many goals they work on at a time and what affects them not reaching their goal. Individuals described how much time they put into planning and obtaining their goals. The group participated in the intervention "My Goal Board" and made personal goal boards to help them achieve their goal. Clinical Observations/Feedback:  Patient came to group and explained that short term goals are things that can be done now and long term goals can be done later. Individual was social with peers and staff while participating in the intervention.  Samaiyah Howes LRT/CTRS         Josefa Syracuse 08/15/2019 11:38 AM

## 2019-08-16 MED ORDER — QUETIAPINE FUMARATE 200 MG PO TABS
200.0000 mg | ORAL_TABLET | Freq: Every day | ORAL | Status: DC
  Administered 2019-08-16: 22:00:00 200 mg via ORAL
  Filled 2019-08-16: qty 1
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
## 2019-08-16 NOTE — Progress Notes (Signed)
[facility] MD Progress Note  08/16/2019 5:37 PM Linda Chapman  MRN:  [id number] Subjective: Patient seen chart reviewed.  Patient who appears to have mixed bipolar disorder.  Started on low-dose Seroquel yesterday.  She attended treatment team today.  She was cooperative and pleasant.  She remains very fidgety somewhat hyperactive.  Does not appear to be psychotic.  Denies suicidal ideation.  Affect is still labile but she is not presenting a problem.  Seems to get along well with others on the ward.  Slept a little better last night.  No new complaints. Principal Problem: Mixed bipolar I disorder ([place]) Diagnosis: Principal Problem:   Mixed bipolar I disorder ([place]) Active Problems:   Cannabis abuse   Suicide attempt ([place])  Total Time spent with patient: 30 minutes  Past Psychiatric History: Patient has a history of mental health problems when she was much younger  Past Medical History:  Past Medical History:  Diagnosis Date  . Bipolar 1 disorder ([place])   . Seasonal allergies    History reviewed. No pertinent surgical history. Family History:  Family History  Problem Relation Age of Onset  . Asthma Mother    Family Psychiatric  History: See previous Social History:  Social History   Substance and Sexual Activity  Alcohol Use No  . Frequency: Never     Social History   Substance and Sexual Activity  Drug Use Yes  . Types: Marijuana   Comment: "just enough"    Social History   Socioeconomic History  . Marital status: Legally Separated    Spouse name: Not on file  . Number of children: Not on file  . Years of education: Not on file  . Highest education level: Not on file  Occupational History  . Not on file  Social Needs  . Financial resource strain: Not on file  . Food insecurity    Worry: Not on file    Inability: Not on file  . Transportation needs    Medical: Not on file    Non-medical: Not on file  Tobacco Use  . Smoking status: Current Every Day Smoker   Packs/day: 1.50    Years: 5.00    Pack years: 7.50    Types: Cigarettes  . Smokeless tobacco: Never Used  . Tobacco comment: Patient refused  Substance and Sexual Activity  . Alcohol use: No    Frequency: Never  . Drug use: Yes    Types: Marijuana    Comment: "just enough"  . Sexual activity: Yes  Lifestyle  . Physical activity    Days per week: Not on file    Minutes per session: Not on file  . Stress: Not on file  Relationships  . Social Herbalist on phone: Not on file    Gets together: Not on file    Attends religious service: Not on file    Active member of club or organization: Not on file    Attends meetings of clubs or organizations: Not on file    Relationship status: Not on file  Other Topics Concern  . Not on file  Social History Narrative  . Not on file   Additional Social History:                         Sleep: Fair  Appetite:  Fair  Current Medications: Current Facility-Administered Medications  Medication Dose Route Frequency Provider Last Rate Last Dose  . acetaminophen (TYLENOL) tablet 650  mg  650 mg Oral Q6H PRN Charm Rings, NP      . alum & mag hydroxide-simeth (MAALOX/MYLANTA) 200-200-20 MG/5ML suspension 30 mL  30 mL Oral Q4H PRN Charm Rings, NP      . magnesium hydroxide (MILK OF MAGNESIA) suspension 30 mL  30 mL Oral Daily PRN Charm Rings, NP      . QUEtiapine (SEROQUEL) tablet 200 mg  200 mg Oral QHS Jhada Risk, Jackquline Denmark, MD        Lab Results: No results found for this or any previous visit (from the past 48 hour(s)).  Blood Alcohol level:  No results found for: [facility]  Metabolic Disorder Labs: No results found for: HGBA1C, MPG No results found for: PROLACTIN No results found for: CHOL, TRIG, HDL, CHOLHDL, VLDL, LDLCALC  Physical Findings: AIMS:  , ,  ,  ,    CIWA:    COWS:     Musculoskeletal: Strength & Muscle Tone: within normal limits Gait & Station: normal Patient leans: N/A  Psychiatric Specialty  Exam: Physical Exam  Nursing note and vitals reviewed. Constitutional: She appears well-developed and well-nourished.  HENT:  Head: Normocephalic and atraumatic.  Eyes: Pupils are equal, round, and reactive to light. Conjunctivae are normal.  Neck: Normal range of motion.  Cardiovascular: Regular rhythm and normal heart sounds.  Respiratory: Effort normal. No respiratory distress.  GI: Soft.  Musculoskeletal: Normal range of motion.  Neurological: She is alert.  Skin: Skin is warm and dry.  Psychiatric: Her affect is labile. Her speech is rapid and/or pressured. She is hyperactive. She is not agitated, not aggressive and not combative. Thought content is not paranoid. She expresses impulsivity. She expresses no homicidal and no suicidal ideation. She exhibits abnormal recent memory.    Review of Systems  Constitutional: Negative.   HENT: Negative.   Eyes: Negative.   Respiratory: Negative.   Cardiovascular: Negative.   Gastrointestinal: Negative.   Musculoskeletal: Negative.   Skin: Negative.   Neurological: Negative.   Psychiatric/Behavioral: Negative for depression, hallucinations, memory loss, substance abuse and suicidal ideas. The patient is nervous/anxious. The patient does not have insomnia.     Blood pressure 90/60, pulse 69, temperature 97.8 F (36.6 C), temperature source Oral, resp. rate 16, height 5\' 5"  (1.651 m), weight 53 kg, last menstrual period 07/29/2019, SpO2 99 %.Body mass index is 19.44 kg/m.  General Appearance: Casual  Eye Contact:  Fair  Speech:  Pressured  Volume:  Increased  Mood:  Anxious and Euphoric  Affect:  Labile  Thought Process:  Disorganized  Orientation:  Full (Time, Place, and Person)  Thought Content:  Illogical, Rumination and Tangential  Suicidal Thoughts:  No  Homicidal Thoughts:  No  Memory:  Immediate;   Fair Recent;   Fair Remote;   Fair  Judgement:  Fair  Insight:  Fair  Psychomotor Activity:  Increased  Concentration:   Concentration: Poor  Recall:  [employer] of Knowledge:  Fair  Language:  Fair  Akathisia:  No  Handed:  Right  AIMS (if indicated):     Assets:  Desire for Improvement Physical Health Resilience  ADL's:  Intact  Cognition:  Impaired,  Mild  Sleep:  Number of Hours: 6.75     Treatment Plan Summary: Daily contact with patient to assess and evaluate symptoms and progress in treatment, Medication management and Plan Started her on 100 mg of Seroquel for mixed bipolar disorder.  Patient still hyperactive a little agitated but not threatening  not tearful today better able to interact with others.  I am increasing the dose of the Seroquel to 200 mg tonight.  Likely discharge in a couple days.  Mordecai RasmussenJohn Tatym Schermer, MD 08/16/2019, 5:37 PM

## 2019-08-16 NOTE — Progress Notes (Signed)
Recreation Therapy Notes   Date: 08/16/2019  Time: 9:30 am  Location: Craft room  Behavioral response: Appropriate   Intervention Topic: Coping-Skills   Discussion/Intervention:  Group content on today was focused on coping skills. The group defined what coping skills are and when they can be used. Individuals described how they normally cope with thing and the coping skills they normally use. Patients expressed why it is important to cope with things and how not coping with things can affect you. The group participated in the intervention "My coping box" and made coping boxes while adding coping skills they could use in the future to the box. Clinical Observations/Feedback:  Patient came to group and defined coping skills as working out emotions and experiences. She explained that she walks and talks with family as her coping skills. Participant express that it is important to cope with things so that things are not bottled up. Individual was social with peers and staff while participating in the intervention.  Brittanie Dosanjh LRT/CTRS         Akeiba Axelson 08/16/2019 11:44 AM

## 2019-08-16 NOTE — [facility] Suicide Risk Assessment (Signed)
[place] INPATIENT:  Family/Significant Other Suicide Prevention Education  Suicide Prevention Education:  Contact Attempts: [name], fiance [phone] has been identified by the patient as the family member/significant other with whom the patient will be residing, and identified as the person(s) who will aid the patient in the event of a mental health crisis.  With written consent from the patient, two attempts were made to provide suicide prevention education, prior to and/or following the patient's discharge.  We were unsuccessful in providing suicide prevention education.  A suicide education pamphlet was given to the patient to share with family/significant other.  Date and time of first attempt: 08/15/19 Date and time of second attempt:08/16/19 at 1125am. CSW left confidential VM  Linda Chapman T Brittini Brubeck 08/16/2019, 11:28 AM

## 2019-08-16 NOTE — Progress Notes (Signed)
Patient stayed in the milieu and attended all the group activities. Pleasant and cooperative and compliant with treatment. Denying thoughts of self harm. Denying hallucinations. Had no major concern throughout the shift.

## 2019-08-16 NOTE — Tx Team (Addendum)
Interdisciplinary Treatment and Diagnostic Plan Update  08/16/2019 Time of Session: 2:30pm Linda Chapman MRN: [id number]  Principal Diagnosis: Mixed bipolar I disorder (HCC)  Secondary Diagnoses: Principal Problem:   Mixed bipolar I disorder (HCC) Active Problems:   Cannabis abuse   Suicide attempt (HCC)   Current Medications:  Current Facility-Administered Medications  Medication Dose Route Frequency Provider Last Rate Last Dose  . acetaminophen (TYLENOL) tablet 650 mg  650 mg Oral Q6H PRN Charm RingsLord, Jamison Y, NP      . alum & mag hydroxide-simeth (MAALOX/MYLANTA) 200-200-20 MG/5ML suspension 30 mL  30 mL Oral Q4H PRN Charm RingsLord, Jamison Y, NP      . magnesium hydroxide (MILK OF MAGNESIA) suspension 30 mL  30 mL Oral Daily PRN Charm RingsLord, Jamison Y, NP      . QUEtiapine (SEROQUEL) tablet 100 mg  100 mg Oral QHS Clapacs, Jackquline DenmarkJohn T, MD   100 mg at 08/15/19 2119   PTA Medications: Medications Prior to Admission  Medication Sig Dispense Refill Last Dose  . cephALEXin (KEFLEX) 500 MG capsule Take 1 capsule (500 mg total) by mouth 2 (two) times daily. (Patient not taking: Reported on 08/12/2019) 14 capsule 0   . etonogestrel (NEXPLANON) 68 MG IMPL implant 1 each by Subdermal route once.     . metroNIDAZOLE (FLAGYL) 500 MG tablet Take 1 tablet (500 mg total) by mouth 3 (three) times daily. (Patient not taking: Reported on 08/12/2019) 21 tablet 0   . metroNIDAZOLE (FLAGYL) 500 MG tablet Take 1 tablet (500 mg total) by mouth 2 (two) times daily. (Patient not taking: Reported on 08/12/2019) 14 tablet 0     Patient Stressors: Loss of marriage (currently separated from her husband) Marital or family conflict Substance abuse  Patient Strengths: Ability for insight Active sense of humor Average or above average intelligence Capable of independent living Communication skills Physical Health  Treatment Modalities: Medication Management, Group therapy, Case management,  1 to 1 session with clinician,  Psychoeducation, Recreational therapy.   Physician Treatment Plan for Primary Diagnosis: Mixed bipolar I disorder (HCC) Long Term Goal(s): Improvement in symptoms so as ready for discharge Improvement in symptoms so as ready for discharge   Short Term Goals: Ability to verbalize feelings will improve Ability to disclose and discuss suicidal ideas Ability to demonstrate self-control will improve Compliance with prescribed medications will improve  Medication Management: Evaluate patient's response, side effects, and tolerance of medication regimen.  Therapeutic Interventions: 1 to 1 sessions, Unit Group sessions and Medication administration.  Evaluation of Outcomes: Progressing  Physician Treatment Plan for Secondary Diagnosis: Principal Problem:   Mixed bipolar I disorder (HCC) Active Problems:   Cannabis abuse   Suicide attempt (HCC)  Long Term Goal(s): Improvement in symptoms so as ready for discharge Improvement in symptoms so as ready for discharge   Short Term Goals: Ability to verbalize feelings will improve Ability to disclose and discuss suicidal ideas Ability to demonstrate self-control will improve Compliance with prescribed medications will improve     Medication Management: Evaluate patient's response, side effects, and tolerance of medication regimen.  Therapeutic Interventions: 1 to 1 sessions, Unit Group sessions and Medication administration.  Evaluation of Outcomes: Progressing   RN Treatment Plan for Primary Diagnosis: Mixed bipolar I disorder (HCC) Long Term Goal(s): Knowledge of disease and therapeutic regimen to maintain health will improve  Short Term Goals: Ability to verbalize frustration and anger appropriately will improve, Ability to demonstrate self-control, Ability to participate in decision making will improve, Ability to verbalize feelings  will improve, Ability to disclose and discuss suicidal ideas and Ability to identify and develop  effective coping behaviors will improve  Medication Management: RN will administer medications as ordered by provider, will assess and evaluate patient's response and provide education to patient for prescribed medication. RN will report any adverse and/or side effects to prescribing provider.  Therapeutic Interventions: 1 on 1 counseling sessions, Psychoeducation, Medication administration, Evaluate responses to treatment, Monitor vital signs and CBGs as ordered, Perform/monitor CIWA, COWS, AIMS and Fall Risk screenings as ordered, Perform wound care treatments as ordered.  Evaluation of Outcomes: Progressing   LCSW Treatment Plan for Primary Diagnosis: Mixed bipolar I disorder ([place]) Long Term Goal(s): Safe transition to appropriate next level of care at discharge, Engage patient in therapeutic group addressing interpersonal concerns.  Short Term Goals: Engage patient in aftercare planning with referrals and resources, Increase social support, Increase ability to appropriately verbalize feelings, Increase emotional regulation and Facilitate acceptance of mental health diagnosis and concerns  Therapeutic Interventions: Assess for all discharge needs, 1 to 1 time with Social worker, Explore available resources and support systems, Assess for adequacy in community support network, Educate family and significant other(s) on suicide prevention, Complete Psychosocial Assessment, Interpersonal group therapy.  Evaluation of Outcomes: Progressing   Progress in Treatment: Attending groups: Yes. Participating in groups: Yes. Taking medication as prescribed: Yes. Toleration medication: Yes. Family/Significant other contact made: Yes, individual(s) contacted:  attempts have been made to contact patient's fiance.  Patient understands diagnosis: Yes. Discussing patient identified problems/goals with staff: Yes. Medical problems stabilized or resolved: Yes. Denies suicidal/homicidal ideation:  Yes. Issues/concerns per patient self-inventory: No. Other: none  New problem(s) identified: No, Describe:  none  New Short Term/Long Term Goal(s):  medication management for mood stabilization; elimination of SI thoughts; development of comprehensive mental wellness plan.  Patient Goals:  "find out what's really going to help me"  Discharge Plan or Barriers: Patient reports plans to attend aftercare with [facility] on 08/23/2019 and returning to her home.   Reason for Continuation of Hospitalization: Aggression Anxiety Depression Medication stabilization Suicidal ideation  Estimated Length of Stay: 1-7 days  Recreational Therapy: Patient Stressors: N/A  Patient Goal: Patient will engage in groups without prompting or encouragement from LRT x3 group sessions within 5 recreation therapy group sessions  Attendees: Patient: Linda Chapman 08/16/2019 3:23 PM  Physician: Dr. Weber Cooks, MD 08/16/2019 3:23 PM  Nursing:  08/16/2019 3:23 PM  RN Care Manager: 08/16/2019 3:23 PM  Social Worker: Assunta Curtis, LCSW 08/16/2019 3:23 PM  Recreational Therapist: Devin Going, LRT 08/16/2019 3:23 PM  Other: Sanjuana Kava, LCSW 08/16/2019 3:23 PM  Other:  08/16/2019 3:23 PM  Other: 08/16/2019 3:23 PM      Scribe for Treatment Team: Rozann Lesches, LCSW 08/16/2019 3:31 PM

## 2019-08-16 NOTE — Plan of Care (Signed)
Visible and active in the milieu. Cooperative and compliant with treatment.

## 2019-08-16 NOTE — BHH Group Notes (Signed)
LCSW Group Therapy Note  08/16/2019 1:00 PM  Type of Therapy/Topic:  Group Therapy:  Emotion Regulation  Participation Level:  Active   Description of Group:   The purpose of this group is to assist patients in learning to regulate negative emotions and experience positive emotions. Patients will be guided to discuss ways in which they have been vulnerable to their negative emotions. These vulnerabilities will be juxtaposed with experiences of positive emotions or situations, and patients will be challenged to use positive emotions to combat negative ones. Special emphasis will be placed on coping with negative emotions in conflict situations, and patients will process healthy conflict resolution skills.  Therapeutic Goals: 1. Patient will identify two positive emotions or experiences to reflect on in order to balance out negative emotions 2. Patient will label two or more emotions that they find the most difficult to experience 3. Patient will demonstrate positive conflict resolution skills through discussion and/or role plays  Summary of Patient Progress: Patient was present in group.  Patient was an active participant and supportive of other group members.  Patient willingly engaged in discussion on how to use coping skills effectively, identifying what has worked for her.  Patient discussed what happens when she is exposed to negative and positive emotions, respectively.  She discussed cheerleading statements to self and establishing boundaries and how that has been helpful to her.    Therapeutic Modalities:   Cognitive Behavioral Therapy Feelings Identification Dialectical Behavioral Therapy  Assunta Curtis, MSW, LCSW 08/16/2019 12:40 PM

## 2019-08-17 MED ORDER — QUETIAPINE FUMARATE 300 MG PO TABS
300.0000 mg | ORAL_TABLET | Freq: Every day | ORAL | Status: DC
  Administered 2019-08-17: 300 mg via ORAL
  Filled 2019-08-17: qty 1
  Filled 2019-08-17: qty 3
  Filled 2019-08-17: qty 1

## 2019-08-17 MED ORDER — QUETIAPINE FUMARATE 300 MG PO TABS: 300.0000 mg | ORAL_TABLET | Freq: Every day | ORAL | 0 refills | Status: DC

## 2019-08-17 NOTE — Progress Notes (Signed)
[facility]  08/17/2019 12:35 PM Linda LemmingsVictoria Chapman  MRN:  [id number] Subjective: Follow-up for this patient who came in with mood lability.  Patient continues to be easily distracted and a little hyperactive but she has been appropriate in her interactions with staff and peers.  Goes to groups appropriately.  Sleeping better.  Able to stay more less on topic during conversation.  No evidence of psychosis denies suicidal or homicidal ideation.  No new physical complaints Principal Problem: Mixed bipolar I disorder (HCC) Diagnosis: Principal Problem:   Mixed bipolar I disorder (HCC) Active Problems:   Cannabis abuse   Suicide attempt (HCC)  Total Time spent with patient: 30 minutes  Past Psychiatric History: History of mood instability and mood and behavior problems especially in youth  Past Medical History:  Past Medical History:  Diagnosis Date  . Bipolar 1 disorder (HCC)   . Seasonal allergies    History reviewed. No pertinent surgical history. Family History:  Family History  Problem Relation Age of Onset  . Asthma Mother    Family Psychiatric  History: See previous Social History:  Social History   Substance and Sexual Activity  Alcohol Use No  . Frequency: Never     Social History   Substance and Sexual Activity  Drug Use Yes  . Types: Marijuana   Comment: "just enough"    Social History   Socioeconomic History  . Marital status: Legally Separated    Spouse name: Not on file  . Number of children: Not on file  . Years of education: Not on file  . Highest education level: Not on file  Occupational History  . Not on file  Social Needs  . Financial resource strain: Not on file  . Food insecurity    Worry: Not on file    Inability: Not on file  . Transportation needs    Medical: Not on file    Non-medical: Not on file  Tobacco Use  . Smoking status: Current Every Day Smoker    Packs/day: 1.50    Years: 5.00    Pack years: 7.50    Types:  Cigarettes  . Smokeless tobacco: Never Used  . Tobacco comment: Patient refused  Substance and Sexual Activity  . Alcohol use: No    Frequency: Never  . Drug use: Yes    Types: Marijuana    Comment: "just enough"  . Sexual activity: Yes  Lifestyle  . Physical activity    Days per week: Not on file    Minutes per session: Not on file  . Stress: Not on file  Relationships  . Social Musicianconnections    Talks on phone: Not on file    Gets together: Not on file    Attends religious service: Not on file    Active member of club or organization: Not on file    Attends meetings of clubs or organizations: Not on file    Relationship status: Not on file  Other Topics Concern  . Not on file  Social History Narrative  . Not on file   Additional Social History:                         Sleep: Fair  Appetite:  Fair  Current Medications: Current Facility-Administered Medications  Medication Dose Route Frequency Provider Last Rate Last Dose  . acetaminophen (TYLENOL) tablet 650 mg  650 mg Oral Q6H PRN Charm RingsLord, Jamison Y, NP      .  alum & mag hydroxide-simeth (MAALOX/MYLANTA) 200-200-20 MG/5ML suspension 30 mL  30 mL Oral Q4H PRN Patrecia Pour, NP      . magnesium hydroxide (MILK OF MAGNESIA) suspension 30 mL  30 mL Oral Daily PRN Patrecia Pour, NP      . QUEtiapine (SEROQUEL) tablet 300 mg  300 mg Oral QHS Clapacs, John T, MD        Lab Results: No results found for this or any previous visit (from the past 48 hour(s)).  Blood Alcohol level:  No results found for: [facility]  Metabolic Disorder Labs: No results found for: HGBA1C, MPG No results found for: PROLACTIN No results found for: CHOL, TRIG, HDL, CHOLHDL, VLDL, LDLCALC  Physical Findings: AIMS:  , ,  ,  ,    CIWA:    COWS:     Musculoskeletal: Strength & Muscle Tone: within normal limits Gait & Station: normal Patient leans: N/A  Psychiatric Specialty Exam: Physical Exam  Nursing note and vitals  reviewed. Constitutional: She appears well-developed and well-nourished.  HENT:  Head: Normocephalic and atraumatic.  Eyes: Pupils are equal, round, and reactive to light. Conjunctivae are normal.  Neck: Normal range of motion.  Cardiovascular: Regular rhythm and normal heart sounds.  Respiratory: Effort normal.  GI: Soft.  Musculoskeletal: Normal range of motion.  Neurological: She is alert.  Skin: Skin is warm and dry.  Psychiatric: Judgment normal. Her affect is labile. Her speech is tangential. She is hyperactive. She is not agitated and not aggressive. Thought content is not paranoid. Cognition and memory are normal. She expresses no homicidal and no suicidal ideation.    Review of Systems  Constitutional: Negative.   HENT: Negative.   Eyes: Negative.   Respiratory: Negative.   Cardiovascular: Negative.   Gastrointestinal: Negative.   Musculoskeletal: Negative.   Skin: Negative.   Neurological: Negative.   Psychiatric/Behavioral: Negative.     Blood pressure 101/74, pulse 75, temperature 97.7 F (36.5 C), temperature source Oral, resp. rate 18, height 5\' 5"  (1.651 m), weight 53 kg, last menstrual period 07/29/2019, SpO2 100 %.Body mass index is 19.44 kg/m.  General Appearance: Casual  Eye Contact:  Good  Speech:  Pressured  Volume:  Normal  Mood:  Euthymic  Affect:  Congruent  Thought Process:  Goal Directed  Orientation:  Full (Time, Place, and Person)  Thought Content:  Logical  Suicidal Thoughts:  No  Homicidal Thoughts:  No  Memory:  Immediate;   Fair Recent;   Fair Remote;   Fair  Judgement:  Fair  Insight:  Fair  Psychomotor Activity:  Restlessness  Concentration:  Concentration: Fair  Recall:  [employer] of Knowledge:  Fair  Language:  Fair  Akathisia:  No  Handed:  Right  AIMS (if indicated):     Assets:  Desire for Improvement Housing Physical Health Resilience Social Support  ADL's:  Intact  Cognition:  WNL  Sleep:  Number of Hours: 6.5      Treatment Plan Summary: Daily contact with patient to assess and evaluate symptoms and progress in treatment, Medication management and Plan Patient has tolerated going up to 200 mg of Seroquel.  We will go up to 300 mg tonight night.  I explained this to her as being a good target dose for mood stability.  I think that she is doing well enough that we can anticipate likely discharge tomorrow.  She will have follow-up at [facility].  Psychoeducation and review of medicine done today.  Patient understanding  and agreeable to plan.  Mordecai Rasmussen, MD 08/17/2019, 12:35 PM

## 2019-08-17 NOTE — Plan of Care (Signed)
Patient is pleasant and cooperative on approach.Appropriate with staff & peers.Denies SI,HI and AVH.Patient stated that she is trying to develop a positive thinking.Attended groups.Appetite and energy level good.Support and encouragement given.

## 2019-08-17 NOTE — BHH Suicide Risk Assessment (Signed)
[facility] Discharge Suicide Risk Assessment   Principal Problem: Mixed bipolar I disorder [facility]) Discharge Diagnoses: Principal Problem:   Mixed bipolar I disorder ([place]) Active Problems:   Cannabis abuse   Suicide attempt ([place])   Total Time spent with patient: 30 minutes  Musculoskeletal: Strength & Muscle Tone: within normal limits Gait & Station: normal Patient leans: N/A  Psychiatric Specialty Exam: Review of Systems  Constitutional: Negative.   HENT: Negative.   Eyes: Negative.   Respiratory: Negative.   Cardiovascular: Negative.   Gastrointestinal: Negative.   Musculoskeletal: Negative.   Skin: Negative.   Neurological: Negative.   Psychiatric/Behavioral: Negative.     Blood pressure 101/74, pulse 75, temperature 97.7 F (36.5 C), temperature source Oral, resp. rate 18, height 5\' 5"  (1.651 m), weight 53 kg, last menstrual period 07/29/2019, SpO2 100 %.Body mass index is 19.44 kg/m.  General Appearance: Casual  Eye Contact::  Good  Speech:  Normal Rate409  Volume:  Normal  Mood:  Euthymic  Affect:  Congruent  Thought Process:  Goal Directed  Orientation:  Full (Time, Place, and Person)  Thought Content:  Logical  Suicidal Thoughts:  No  Homicidal Thoughts:  No  Memory:  Immediate;   Fair Recent;   Fair Remote;   Fair  Judgement:  Fair  Insight:  Fair  Psychomotor Activity:  Restlessness  Concentration:  Fair  Recall:  [employer] of Knowledge:Fair  Language: Fair  Akathisia:  No  Handed:  Right  AIMS (if indicated):     Assets:  Desire for Improvement Physical Health Resilience Social Support  Sleep:  Number of Hours: 6.5  Cognition: WNL  ADL's:  Intact   Mental Status Per Nursing Assessment::   On Admission:  NA  Demographic Factors:  Caucasian  Loss Factors: Financial problems/change in socioeconomic status  Historical Factors: Impulsivity  Risk Reduction Factors:   Employed, Living with another person, especially a relative, Positive  social support and Positive coping skills or problem solving skills  Continued Clinical Symptoms:  Bipolar Disorder:   Mixed State  Cognitive Features That Contribute To Risk:  None    Suicide Risk:  Minimal: No identifiable suicidal ideation.  Patients presenting with no risk factors but with morbid ruminations; may be classified as minimal risk based on the severity of the depressive symptoms  Follow-up Information    [facility] Follow up on 08/23/2019.   Why: You are scheduled with Lucious Groves for a telephone appointment on Wednesday, September 2nd at 10:00am.  Beverly Sessions will contact you at [phone]. Thank you. Contact information: [address] [place] [address] [phone]           Plan Of Care/Follow-up recommendations:  Activity:  Activity as tolerated Diet:  Regular diet Other:  Follow-up with [facility].  Continue current medicine.  Alethia Berthold, MD 08/17/2019, 12:56 PM

## 2019-08-17 NOTE — Progress Notes (Signed)
Recreation Therapy Notes   Date: 08/17/2019  Time: 9:30 am  Location: Craft room  Behavioral response: Appropriate   Intervention Topic: Happiness   Discussion/Intervention:  Group content today was focused on Happiness. The group defined happiness and described where happiness comes from. Individuals identified what makes them happy and how they go about making others happy. Patients expressed things that stop them from being happy and ways they can improve their happiness. The group stated reasons why it is important to be happy. The group participated in the intervention "My Happiness", where they had a chance to identify and express things that make them happy. Clinical Observations/Feedback:  Patient came to group and defined happiness as something that makes you feel enlightened and gives you hope. She stated that happiness comes from the brain. Participant explained that you can only make yourself happy. Patient described overthinking as a way she keeps herself from being happy. Individual was social with peers and staff while participating in the intervention.    Jadence Kinlaw LRT/CTRS         Laporsha Grealish 08/17/2019 11:09 AM

## 2019-08-17 NOTE — BHH Group Notes (Signed)
[facility] Group Notes:  (Nursing/MHT/Case Management/Adjunct)  Date:  08/17/2019  Time:  8:40 PM  Type of Therapy:  Group Therapy  Participation Level:  Active  Participation Quality:  Attentive  Affect:  Appropriate  Cognitive:  Alert  Insight:  Good  Engagement in Group:  Engaged    Modes of Intervention:  Support  Summary of Progress/Problems:  Linda Chapman 08/17/2019, 8:40 PM

## 2019-08-17 NOTE — Progress Notes (Signed)
Recreation Therapy Notes  INPATIENT RECREATION THERAPY ASSESSMENT  Patient Details Name: Linda Chapman MRN: [id number] DOB: August 10, 2001 Today's Date: 08/17/2019       Information Obtained From: Patient  Able to Participate in Assessment/Interview: Yes  Patient Presentation: Responsive  Reason for Admission (Per Patient): Active Symptoms, Suicidal Ideation, Suicide Attempt  Patient Stressors:    Coping Skills:   Self-Injury, Exercise, Substance Abuse, Talk, Prayer  Leisure Interests (2+):  Exercise - Walking, Art - Paint, Art - Coloring, Music - Listen  Frequency of Recreation/Participation: Weekly  Awareness of Community Resources:     [employer]:     Current Use:    If no, Barriers?:    Expressed Interest in [employer] Information:    [place] of Residence:  Insurance underwriter  Patient Main Form of Transportation: Musician  Patient Strengths:  Nice to others  Patient Identified Areas of Improvement:  coping skills  Patient Goal for Hospitalization:  Find what is really going to help me.  Current SI (including self-harm):  No  Current HI:  No  Current AVH: No  Staff Intervention Plan: Collaborate with Interdisciplinary Treatment Team, Group Attendance  Consent to Intern Participation: N/A  Linda Chapman 08/17/2019, 12:09 PM

## 2019-08-17 NOTE — BHH Group Notes (Signed)
LCSW Group Therapy Note  08/17/2019 12:26 PM  Type of Therapy/Topic:  Group Therapy:  Balance in Life  Participation Level:  Active  Description of Group:    This group will address the concept of balance and how it feels and looks when one is unbalanced. Patients will be encouraged to process areas in their lives that are out of balance and identify reasons for remaining unbalanced. Facilitators will guide patients in utilizing problem-solving interventions to address and correct the stressor making their life unbalanced. Understanding and applying boundaries will be explored and addressed for obtaining and maintaining a balanced life. Patients will be encouraged to explore ways to assertively make their unbalanced needs known to significant others in their lives, using other group members and facilitator for support and feedback.  Therapeutic Goals: 1. Patient will identify two or more emotions or situations they have that consume much of in their lives. 2. Patient will identify signs/triggers that life has become out of balance:  3. Patient will identify two ways to set boundaries in order to achieve balance in their lives:  4. Patient will demonstrate ability to communicate their needs through discussion and/or role plays  Summary of Patient Progress: Pt was appropriate and respectful in group. Pt was able to identify balance in her life as being "evenly proportioned and being at peace". Pt was reassuring with other group members and identified things that throw her off balance as trauma and drama. Pt reported in order to maintain balance, she can surround herself with positive people and positive energy.      Therapeutic Modalities:   Cognitive Behavioral Therapy Solution-Focused Therapy Assertiveness Training  Evalina Field, MSW, LCSW Clinical Social Work 08/17/2019 12:26 PM

## 2019-08-17 NOTE — Progress Notes (Signed)
Patient alert and oriented x 4, affect is blunted, mood is labile ,mood is congruent with affect denies SI/HI/AVH, appears less manic, speech is less pressured and she is not hyper active. Patient is interacting appropriately with peers, her thoughts is organized.  Patient is complaint with medication regimen, no distress noted. 15 minutes safety checks maintained will continue to monitor

## 2019-08-18 MED ORDER — QUETIAPINE FUMARATE 300 MG PO TABS: 300.0000 mg | ORAL_TABLET | Freq: Every day | ORAL | 2 refills | Status: DC

## 2019-08-18 NOTE — Progress Notes (Signed)
Patient alert and oriented x 4, affect is blunted, patient appears overly excited, mood is congruent with affectdenies SI/HI/AVH, appears less manic, speech is less pressured and non tangential she is interacting appropriately with peers, her thoughts are organized.she is receptive to nursing staff, and she attended evening wrap up group. Patient is complaint with medication regimen, no distress noted. 15 minutes safety checks maintained will continue to monitor

## 2019-08-18 NOTE — Progress Notes (Signed)
  [facility] Adult Case Management Discharge Plan :  Will you be returning to the same living situation after discharge:  Yes,  lives with significant other At discharge, do you have transportation home?: Yes,  significant other will pick up Do you have the ability to pay for your medications: Yes,  insurance  Release of information consent forms completed and in the chart;  Patient's signature needed at discharge.  Patient to Follow up at: Follow-up Information    [facility] Follow up on 08/23/2019.   Why: You are scheduled with Lucious Groves for a telephone appointment on Wednesday, September 2nd at 10:00am.  Beverly Sessions will contact you at [phone]. Thank you. Contact information: [address] [place] [address] [phone]           Next level of care provider has access to [facility] and Suicide Prevention discussed: Yes,  with pt; multiple attempts made to contact significant other but no response  Have you used any form of tobacco in the last 30 days? (Cigarettes, Smokeless Tobacco, Cigars, and/or Pipes): Yes  Has patient been referred to the Quitline?: N/A patient is not a smoker  Patient has been referred for addiction treatment: [place], LCSW 08/18/2019, 10:06 AM

## 2019-08-18 NOTE — Progress Notes (Signed)
Recreation Therapy Notes   Date: 08/18/2019  Time: 9:30 am  Location: Craft room  Behavioral response: Appropriate   Intervention Topic: Emotions   Discussion/Intervention:  Group content on today was focused on emotions. The group identified what emotions are and why it is important to have emotions. Patients expressed some positive and negative emotions. Individuals gave some past experiences on how they normally dealt with emotions in the past. The group described some positive ways to deal with emotions in the future. Patients participated in the intervention "Name the Linda Chapman" where individuals were given a chance to experience different emotions.  Clinical Observations/Feedback:  Patient came to group and defined emotions as feelings and different ways to express yourself. She stated that emotions come from the brain. Participant described situations where she has let others affect her emotions in a negative way. Individual was social with peers and staff while participating in the intervention.    Linda Chapman LRT/CTRS          Linda Chapman 08/18/2019 11:16 AM

## 2019-08-18 NOTE — Plan of Care (Signed)
  Problem: Education: Goal: Ability to state activities that reduce stress will improve Outcome: Progressing  Patient has ability to state and express activities that reduce stress will improve.

## 2019-08-18 NOTE — Progress Notes (Signed)
Patient denies SI/HI, denies A/V hallucinations. Patient verbalizes understanding of discharge instructions, follow up care and prescriptions.7 days medicine given to patient. Patient given all belongings from [facility] locker. Patient escorted out by staff, transported by family. 

## 2019-08-18 NOTE — Progress Notes (Signed)
Recreation Therapy Notes  INPATIENT RECREATION TR PLAN  Patient Details Name: Linda Chapman MRN: [id number] DOB: April 10, 2001 Today's Date: 08/18/2019  Rec Therapy Plan Is patient appropriate for Therapeutic Recreation?: Yes Treatment times per week: At least 3 Estimated Length of Stay: 5-7 days TR Treatment/Interventions: Group participation (Comment)  Discharge Criteria Pt will be discharged from therapy if:: Discharged Treatment plan/goals/alternatives discussed and agreed upon by:: Patient/family  Discharge Summary Short term goals set: Patient will engage in groups without prompting or encouragement from LRT x3 group sessions within 5 recreation therapy group sessions Short term goals met: Complete Progress toward goals comments: Groups attended Which groups?: Coping skills, Goal setting, Other (Comment)(Emotions, Happiness) Reason goals not met: N/A Therapeutic equipment acquired: N/A Reason patient discharged from therapy: Discharge from hospital Pt/family agrees with progress & goals achieved: Yes Date patient discharged from therapy: 08/18/19   Nery Kalisz 08/18/2019, 11:34 AM

## 2019-08-22 NOTE — Discharge Summary (Signed)
Physician Discharge Summary Note  Patient:  Linda Chapman is an 18 y.o., female MRN:  [id number] DOB:  06/24/2001 Patient phone:  [phone] (home)  Patient address:   [address] [place] [address],  Total Time spent with patient: 45 minutes  Date of Admission:  08/14/2019 Date of Discharge: 08/18/2019  Reason for Admission: Admitted through the emergency room because of a impulsive suicide attempt by swallowing pills  Principal Problem: Mixed bipolar I disorder [facility]) Discharge Diagnoses: Principal Problem:   Mixed bipolar I disorder (HCC) Active Problems:   Cannabis abuse   Suicide attempt [facility])   Past Psychiatric History: Patient gives a history of mood instability and behavior problems going back into adolescence but had not had significant treatment as an adult.  Past Medical History:  Past Medical History:  Diagnosis Date  . Bipolar 1 disorder (HCC)   . Seasonal allergies    History reviewed. No pertinent surgical history. Family History:  Family History  Problem Relation Age of Onset  . Asthma Mother    Family Psychiatric  History: Possible bipolar disorder Social History:  Social History   Substance and Sexual Activity  Alcohol Use No  . Frequency: Never     Social History   Substance and Sexual Activity  Drug Use Yes  . Types: Marijuana   Comment: "just enough"    Social History   Socioeconomic History  . Marital status: Legally Separated    Spouse name: Not on file  . Number of children: Not on file  . Years of education: Not on file  . Highest education level: Not on file  Occupational History  . Not on file  Social Needs  . Financial resource strain: Not on file  . Food insecurity    Worry: Not on file    Inability: Not on file  . Transportation needs    Medical: Not on file    Non-medical: Not on file  Tobacco Use  . Smoking status: Current Every Day Smoker    Packs/day: 1.50    Years: 5.00    Pack years: 7.50    Types:  Cigarettes  . Smokeless tobacco: Never Used  . Tobacco comment: Patient refused  Substance and Sexual Activity  . Alcohol use: No    Frequency: Never  . Drug use: Yes    Types: Marijuana    Comment: "just enough"  . Sexual activity: Yes  Lifestyle  . Physical activity    Days per week: Not on file    Minutes per session: Not on file  . Stress: Not on file  Relationships  . Social Musician on phone: Not on file    Gets together: Not on file    Attends religious service: Not on file    Active member of club or organization: Not on file    Attends meetings of clubs or organizations: Not on file    Relationship status: Not on file  Other Topics Concern  . Not on file  Social History Narrative  . Not on file    Hospital Course: Patient was put on 15-minute checks.  She did not display any dangerous or aggressive or inappropriate behavior.  She was cooperative with treatment.  Patient's mental status showed great lability of mood difficulty maintaining concentration and hyperactivity but no frank psychosis.  Differential diagnosis to my mind included bipolar disorder as well as possibly ADHD with hyperactivity.  Psychoeducation with the patient review of treatment options.  She was  started on quetiapine which was titrated up to 300 mg at night.  Patient tolerated medicine without significant side effects.  She showed some calming down of her mood and better able to be able to focus.  She did not display any dangerous behaviors throughout her time in the hospital.  At discharge she was referred to outpatient follow-up.  Physical Findings: AIMS:  , ,  ,  ,    CIWA:    COWS:     Musculoskeletal: Strength & Muscle Tone: within normal limits Gait & Station: normal Patient leans: N/A  Psychiatric Specialty Exam: Physical Exam  Nursing note and vitals reviewed. Constitutional: She appears well-developed and well-nourished.  HENT:  Head: Normocephalic and atraumatic.   Eyes: Pupils are equal, round, and reactive to light. Conjunctivae are normal.  Neck: Normal range of motion.  Cardiovascular: Regular rhythm and normal heart sounds.  Respiratory: Effort normal. No respiratory distress.  GI: Soft.  Musculoskeletal: Normal range of motion.  Neurological: She is alert.  Skin: Skin is warm and dry.  Psychiatric: She has a normal mood and affect. Her speech is normal and behavior is normal. Judgment and thought content normal. Cognition and memory are normal.    Review of Systems  Constitutional: Negative.   HENT: Negative.   Eyes: Negative.   Respiratory: Negative.   Cardiovascular: Negative.   Gastrointestinal: Negative.   Musculoskeletal: Negative.   Skin: Negative.   Neurological: Negative.   Psychiatric/Behavioral: Negative.     Blood pressure 126/76, pulse 72, temperature (!) 97.4 F (36.3 C), temperature source Oral, resp. rate 18, height 5\' 5"  (1.651 m), weight 53 kg, last menstrual period 07/29/2019, SpO2 98 %.Body mass index is 19.44 kg/m.  General Appearance: Casual  Eye Contact:  Fair  Speech:  Clear and Coherent  Volume:  Normal  Mood:  Euthymic  Affect:  Congruent  Thought Process:  Goal Directed  Orientation:  Full (Time, Place, and Person)  Thought Content:  Logical  Suicidal Thoughts:  No  Homicidal Thoughts:  No  Memory:  Immediate;   Fair Recent;   Fair Remote;   Fair  Judgement:  Fair  Insight:  Fair  Psychomotor Activity:  Normal  Concentration:  Concentration: Fair  Recall:  FiservFair  Fund of Knowledge:  Fair  Language:  Fair  Akathisia:  No  Handed:  Right  AIMS (if indicated):     Assets:  Desire for Improvement Housing Physical Health  ADL's:  Intact  Cognition:  WNL  Sleep:  Number of Hours: 6     Have you used any form of tobacco in the last 30 days? (Cigarettes, Smokeless Tobacco, Cigars, and/or Pipes): Yes  Has this patient used any form of tobacco in the last 30 days? (Cigarettes, Smokeless Tobacco,  Cigars, and/or Pipes) Yes, Yes, A prescription for an FDA-approved tobacco cessation medication was offered at discharge and the patient refused  Blood Alcohol level:  No results found for: [facility]  Metabolic Disorder Labs:  No results found for: HGBA1C, MPG No results found for: PROLACTIN No results found for: CHOL, TRIG, HDL, CHOLHDL, VLDL, LDLCALC  See Psychiatric Specialty Exam and Suicide Risk Assessment completed by Attending Physician prior to discharge.  Discharge destination:  Home  Is patient on multiple antipsychotic therapies at discharge:  No   Has Patient had three or more failed trials of antipsychotic monotherapy by history:  No  Recommended Plan for Multiple Antipsychotic Therapies: NA  Discharge Instructions    Diet - low sodium heart  healthy   Complete by: As directed    Increase activity slowly   Complete by: As directed      Allergies as of 08/18/2019   No Known Allergies     Medication List    STOP taking these medications   cephALEXin 500 MG capsule Commonly known as: KEFLEX   metroNIDAZOLE 500 MG tablet Commonly known as: Flagyl     TAKE these medications     Indication  Nexplanon 68 MG Impl implant Generic drug: etonogestrel 1 each by Subdermal route once.  Indication: Birth Control Treatment   QUEtiapine 300 MG tablet Commonly known as: SEROQUEL Take 1 tablet (300 mg total) by mouth at bedtime.  Indication: Depressive Phase of Manic-Depression      Follow-up Information    [facility] Follow up on 08/23/2019.   Why: You are scheduled with Lucious Groves for a telephone appointment on Wednesday, September 2nd at 10:00am.  Beverly Sessions will contact you at [phone]. Thank you. Contact information: [address] [place] [address] [phone]           Follow-up recommendations:  Activity:  Activity as tolerated Diet:  Regular diet Other:  Follow-up with [facility]  Comments: Samples and prescriptions given at  discharge  Signed: Alethia Berthold, MD 08/22/2019, 6:47 PM

## 2019-09-25 ENCOUNTER — Other Ambulatory Visit: Payer: Self-pay

## 2019-10-02 DIAGNOSIS — F1110 Opioid abuse, uncomplicated: Secondary | ICD-10-CM

## 2019-10-02 DIAGNOSIS — F1510 Other stimulant abuse, uncomplicated: Secondary | ICD-10-CM

## 2019-10-02 DIAGNOSIS — F1410 Cocaine abuse, uncomplicated: Secondary | ICD-10-CM

## 2019-10-12 ENCOUNTER — Other Ambulatory Visit: Payer: Self-pay

## 2019-10-12 ENCOUNTER — Ambulatory Visit (LOCAL_COMMUNITY_HEALTH_CENTER): Payer: 59

## 2019-10-12 VITALS — BP 99/61 | Ht 63.0 in | Wt 118.0 lb

## 2019-10-12 DIAGNOSIS — Z3201 Encounter for pregnancy test, result positive: Secondary | ICD-10-CM | POA: Diagnosis not present

## 2019-10-12 LAB — PREGNANCY, URINE: Preg Test, Ur: POSITIVE — AB

## 2019-10-12 MED ORDER — PRENATAL VITAMIN 27-0.8 MG PO TABS: 1.0000 | ORAL_TABLET | Freq: Every day | ORAL | 0 refills | Status: DC

## 2019-10-12 NOTE — Progress Notes (Signed)
Client has alerted therapist in [facility]) of pregnancy. Next scheduled appt with therapist is next month. Client does not have [facility] record. Rich Number, RN

## 2019-10-17 ENCOUNTER — Encounter: Payer: Self-pay | Admitting: Advanced Practice Midwife

## 2019-10-17 ENCOUNTER — Other Ambulatory Visit: Payer: Self-pay

## 2019-10-17 ENCOUNTER — Ambulatory Visit: Payer: 59 | Admitting: Advanced Practice Midwife

## 2019-10-17 VITALS — BP 106/66 | Temp 97.7°F | Wt 119.2 lb

## 2019-10-17 DIAGNOSIS — Z62810 Personal history of physical and sexual abuse in childhood: Secondary | ICD-10-CM | POA: Insufficient documentation

## 2019-10-17 DIAGNOSIS — F17200 Nicotine dependence, unspecified, uncomplicated: Secondary | ICD-10-CM | POA: Diagnosis not present

## 2019-10-17 DIAGNOSIS — F1210 Cannabis abuse, uncomplicated: Secondary | ICD-10-CM

## 2019-10-17 DIAGNOSIS — F1110 Opioid abuse, uncomplicated: Secondary | ICD-10-CM

## 2019-10-17 DIAGNOSIS — O0990 Supervision of high risk pregnancy, unspecified, unspecified trimester: Secondary | ICD-10-CM

## 2019-10-17 DIAGNOSIS — F3160 Bipolar disorder, current episode mixed, unspecified: Secondary | ICD-10-CM

## 2019-10-17 DIAGNOSIS — F988 Other specified behavioral and emotional disorders with onset usually occurring in childhood and adolescence: Secondary | ICD-10-CM | POA: Insufficient documentation

## 2019-10-17 DIAGNOSIS — O99311 Alcohol use complicating pregnancy, first trimester: Secondary | ICD-10-CM

## 2019-10-17 DIAGNOSIS — T1491XA Suicide attempt, initial encounter: Secondary | ICD-10-CM

## 2019-10-17 DIAGNOSIS — F419 Anxiety disorder, unspecified: Secondary | ICD-10-CM | POA: Insufficient documentation

## 2019-10-17 DIAGNOSIS — F1410 Cocaine abuse, uncomplicated: Secondary | ICD-10-CM

## 2019-10-17 DIAGNOSIS — O0991 Supervision of high risk pregnancy, unspecified, first trimester: Secondary | ICD-10-CM | POA: Diagnosis not present

## 2019-10-17 DIAGNOSIS — Z91410 Personal history of adult physical and sexual abuse: Secondary | ICD-10-CM

## 2019-10-17 DIAGNOSIS — Z3402 Encounter for supervision of normal first pregnancy, second trimester: Secondary | ICD-10-CM | POA: Insufficient documentation

## 2019-10-17 DIAGNOSIS — F6089 Other specific personality disorders: Secondary | ICD-10-CM | POA: Insufficient documentation

## 2019-10-17 DIAGNOSIS — F1010 Alcohol abuse, uncomplicated: Secondary | ICD-10-CM | POA: Insufficient documentation

## 2019-10-17 DIAGNOSIS — O99310 Alcohol use complicating pregnancy, unspecified trimester: Secondary | ICD-10-CM | POA: Insufficient documentation

## 2019-10-17 DIAGNOSIS — F1510 Other stimulant abuse, uncomplicated: Secondary | ICD-10-CM

## 2019-10-17 LAB — URINALYSIS
Bilirubin, UA: NEGATIVE
Glucose, UA: NEGATIVE
Ketones, UA: NEGATIVE
Leukocytes,UA: NEGATIVE
Nitrite, UA: NEGATIVE
Protein,UA: NEGATIVE
RBC, UA: NEGATIVE
Specific Gravity, UA: 1.025 (ref 1.005–1.030)
Urobilinogen, Ur: 0.2 mg/dL (ref 0.2–1.0)
pH, UA: 7 (ref 5.0–7.5)

## 2019-10-17 LAB — WET PREP FOR TRICH, YEAST, CLUE
Trichomonas Exam: NEGATIVE
Yeast Exam: NEGATIVE

## 2019-10-17 LAB — OB RESULTS CONSOLE HIV ANTIBODY (ROUTINE TESTING): HIV: NONREACTIVE

## 2019-10-17 LAB — HEMOGLOBIN, FINGERSTICK: Hemoglobin: 13.5 g/dL (ref 11.1–15.9)

## 2019-10-17 MED ORDER — METRONIDAZOLE 250 MG PO TABS: 250.0000 mg | ORAL_TABLET | Freq: Three times a day (TID) | ORAL | 0 refills | Status: AC

## 2019-10-17 NOTE — Progress Notes (Signed)
In for new ob; has PNV; c/o n/v occ.;initial labs today; desires flu vaccine today Debera Lat, RN   Wet prep reviewed-+BV treated per standing order; informed [facility] will call for 1st screening appt Debera Lat, RN
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
## 2019-10-18 DIAGNOSIS — O26891 Other specified pregnancy related conditions, first trimester: Secondary | ICD-10-CM | POA: Insufficient documentation

## 2019-10-18 DIAGNOSIS — Z6791 Unspecified blood type, Rh negative: Secondary | ICD-10-CM | POA: Insufficient documentation

## 2019-10-18 LAB — CBC/D/PLT+RPR+RH+ABO+AB SCR
Antibody Screen: NEGATIVE
Basophils Absolute: 0 10*3/uL (ref 0.0–0.2)
Basos: 0 %
EOS (ABSOLUTE): 0.2 10*3/uL (ref 0.0–0.4)
Eos: 2 %
Hematocrit: 38.4 % (ref 34.0–46.6)
Hemoglobin: 13.3 g/dL (ref 11.1–15.9)
Hepatitis B Surface Ag: NEGATIVE
Immature Grans (Abs): 0 10*3/uL (ref 0.0–0.1)
Immature Granulocytes: 0 %
Lymphocytes Absolute: 1.6 10*3/uL (ref 0.7–3.1)
Lymphs: 17 %
MCH: 32.2 pg (ref 26.6–33.0)
MCHC: 34.6 g/dL (ref 31.5–35.7)
MCV: 93 fL (ref 79–97)
Monocytes Absolute: 0.8 10*3/uL (ref 0.1–0.9)
Monocytes: 8 %
Neutrophils Absolute: 7.2 10*3/uL — ABNORMAL HIGH (ref 1.4–7.0)
Neutrophils: 73 %
Platelets: 290 10*3/uL (ref 150–450)
RBC: 4.13 x10E6/uL (ref 3.77–5.28)
RDW: 12 % (ref 11.7–15.4)
RPR Ser Ql: NONREACTIVE
Rh Factor: NEGATIVE
WBC: 9.9 10*3/uL (ref 3.4–10.8)

## 2019-10-19 LAB — URINE CULTURE

## 2019-10-19 LAB — CHLAMYDIA/GC NAA, CONFIRMATION
Chlamydia trachomatis, NAA: NEGATIVE
Neisseria gonorrhoeae, NAA: NEGATIVE

## 2019-10-21 LAB — 789231 7+OXYCODONE-BUND
Amphetamines, Urine: NEGATIVE ng/mL
BENZODIAZ UR QL: NEGATIVE ng/mL
Barbiturate screen, urine: NEGATIVE ng/mL
Cocaine (Metab.): NEGATIVE ng/mL
OPIATE SCREEN URINE: NEGATIVE ng/mL
Oxycodone/Oxymorphone, Urine: NEGATIVE ng/mL
PCP Quant, Ur: NEGATIVE ng/mL

## 2019-10-21 LAB — CANNABINOID CONFIRMATION, UR
CANNABINOIDS: POSITIVE — AB
Carboxy THC GC/MS Conf: 137 ng/mL

## 2019-10-23 ENCOUNTER — Encounter: Payer: Self-pay | Admitting: Advanced Practice Midwife

## 2019-10-24 ENCOUNTER — Other Ambulatory Visit: Payer: Self-pay

## 2019-10-24 ENCOUNTER — Encounter: Payer: Self-pay | Admitting: Emergency Medicine

## 2019-10-24 ENCOUNTER — Emergency Department
Admission: EM | Admit: 2019-10-24 | Discharge: 2019-10-24 | Disposition: A | Discharge status: Home / Self Care | Payer: 59 | Attending: Emergency Medicine | Admitting: Emergency Medicine

## 2019-10-24 DIAGNOSIS — F17210 Nicotine dependence, cigarettes, uncomplicated: Secondary | ICD-10-CM | POA: Insufficient documentation

## 2019-10-24 DIAGNOSIS — Z3A11 11 weeks gestation of pregnancy: Secondary | ICD-10-CM | POA: Insufficient documentation

## 2019-10-24 DIAGNOSIS — Z79899 Other long term (current) drug therapy: Secondary | ICD-10-CM | POA: Diagnosis not present

## 2019-10-24 DIAGNOSIS — R55 Syncope and collapse: Secondary | ICD-10-CM | POA: Diagnosis not present

## 2019-10-24 DIAGNOSIS — O99891 Other specified diseases and conditions complicating pregnancy: Secondary | ICD-10-CM | POA: Insufficient documentation

## 2019-10-24 LAB — BASIC METABOLIC PANEL
Anion gap: 9 (ref 5–15)
BUN: 9 mg/dL (ref 6–20)
CO2: 24 mmol/L (ref 22–32)
Calcium: 9 mg/dL (ref 8.9–10.3)
Chloride: 104 mmol/L (ref 98–111)
Creatinine, Ser: 0.41 mg/dL — ABNORMAL LOW (ref 0.44–1.00)
GFR calc Af Amer: >60 mL/min (ref >60)
GFR calc non Af Amer: >60 mL/min (ref >60)
Glucose, Bld: 91 mg/dL (ref 70–99)
Potassium: 3.8 mmol/L (ref 3.5–5.1)
Sodium: 137 mmol/L (ref 135–145)

## 2019-10-24 LAB — URINALYSIS, COMPLETE (UACMP) WITH MICROSCOPIC
Bacteria, UA: NONE SEEN
Bilirubin Urine: NEGATIVE
Glucose, UA: NEGATIVE mg/dL
Hgb urine dipstick: NEGATIVE
Ketones, ur: NEGATIVE mg/dL
Leukocytes,Ua: NEGATIVE
Nitrite: NEGATIVE
Protein, ur: NEGATIVE mg/dL
Specific Gravity, Urine: 1.018 (ref 1.005–1.030)
pH: 7 (ref 5.0–8.0)

## 2019-10-24 LAB — GLUCOSE, CAPILLARY: Glucose-Capillary: 81 mg/dL (ref 70–99)

## 2019-10-24 LAB — CBC
HCT: 37.3 % (ref 36.0–46.0)
Hemoglobin: 12.8 g/dL (ref 12.0–15.0)
MCH: 32 pg (ref 26.0–34.0)
MCHC: 34.3 g/dL (ref 30.0–36.0)
MCV: 93.3 fL (ref 80.0–100.0)
Platelets: 268 10*3/uL (ref 150–400)
RBC: 4 MIL/uL (ref 3.87–5.11)
RDW: 12.7 % (ref 11.5–15.5)
WBC: 10.5 10*3/uL (ref 4.0–10.5)
nRBC: 0 % (ref 0.0–0.2)

## 2019-10-24 MED ORDER — SODIUM CHLORIDE 0.9% FLUSH: 3.0000 mL | Freq: Once | INTRAVENOUS | Status: DC

## 2019-10-24 MED ORDER — SODIUM CHLORIDE 0.9 % IV BOLUS
1000.0000 mL | Freq: Once | INTRAVENOUS | Status: AC
  Administered 2019-10-24: 15:00:00 1000 mL via INTRAVENOUS

## 2019-10-24 MED ORDER — METOCLOPRAMIDE HCL 10 MG PO TABS: 10.0000 mg | ORAL_TABLET | Freq: Three times a day (TID) | ORAL | 0 refills | Status: DC | PRN

## 2019-10-24 NOTE — ED Notes (Signed)
Pt states feels better after fluids. Ambulatory with no issues.

## 2019-10-24 NOTE — ED Triage Notes (Signed)
Pt to ED from work after near syncope episode today.  States also had an episode yesterday.  Had n/v today.  Not eating or drinking very much.  Denies urinary changes, denies pertinent medical hx.  States a non productive cough, denies fevers.  States had [place] a couple days ago and told approx [redacted] wks pregnant.

## 2019-10-24 NOTE — Discharge Instructions (Addendum)
Please seek medical attention for any high fevers, chest pain, shortness of breath, change in behavior, persistent vomiting, bloody stool or any other new or concerning symptoms.  
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
## 2019-10-31 ENCOUNTER — Other Ambulatory Visit: Payer: Self-pay

## 2019-10-31 DIAGNOSIS — Z20822 Contact with and (suspected) exposure to covid-19: Secondary | ICD-10-CM

## 2019-11-02 ENCOUNTER — Encounter: Payer: Self-pay | Admitting: Physician Assistant

## 2019-11-02 DIAGNOSIS — Z6791 Unspecified blood type, Rh negative: Secondary | ICD-10-CM

## 2019-11-02 LAB — NOVEL CORONAVIRUS, NAA: SARS-CoV-2, NAA: NOT DETECTED

## 2019-11-03 ENCOUNTER — Telehealth: Payer: Self-pay | Admitting: General Practice

## 2019-11-03 NOTE — Telephone Encounter (Signed)
Negative COVID results given. Patient results "NOT Detected." Caller expressed understanding. ° °

## 2019-11-14 ENCOUNTER — Ambulatory Visit: Payer: Self-pay

## 2019-11-23 ENCOUNTER — Encounter: Payer: Self-pay | Admitting: Physician Assistant

## 2019-11-23 ENCOUNTER — Other Ambulatory Visit: Payer: Self-pay

## 2019-11-23 ENCOUNTER — Ambulatory Visit: Payer: 59 | Admitting: Physician Assistant

## 2019-11-23 VITALS — BP 111/62 | Temp 97.4°F | Wt 127.2 lb

## 2019-11-23 DIAGNOSIS — F1210 Cannabis abuse, uncomplicated: Secondary | ICD-10-CM

## 2019-11-23 DIAGNOSIS — Z3402 Encounter for supervision of normal first pregnancy, second trimester: Secondary | ICD-10-CM

## 2019-11-23 DIAGNOSIS — O26891 Other specified pregnancy related conditions, first trimester: Secondary | ICD-10-CM

## 2019-11-23 DIAGNOSIS — Z6791 Unspecified blood type, Rh negative: Secondary | ICD-10-CM

## 2019-11-23 DIAGNOSIS — Z3400 Encounter for supervision of normal first pregnancy, unspecified trimester: Secondary | ICD-10-CM
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
## 2019-11-23 NOTE — Progress Notes (Signed)
In for visit; denies hospital visits but was tested for Covid which was Neg; aware of 12/11/19 u/s; reports did not have genetic testing @ [facility]-desires Quad screen today; discussed Rh Neg and need for Immune Globulin-info. Given; offered Narcan kit-declines, reports doesn't know anyone that would need it.  , RN  

## 2019-11-29 ENCOUNTER — Encounter: Payer: Self-pay | Admitting: Advanced Practice Midwife

## 2019-11-29 DIAGNOSIS — R825 Elevated urine levels of drugs, medicaments and biological substances: Secondary | ICD-10-CM | POA: Insufficient documentation

## 2019-11-29 LAB — 789231 7+OXYCODONE-BUND
Amphetamines, Urine: NEGATIVE ng/mL
BENZODIAZ UR QL: NEGATIVE ng/mL
Barbiturate screen, urine: NEGATIVE ng/mL
Cocaine (Metab.): NEGATIVE ng/mL
OPIATE SCREEN URINE: NEGATIVE ng/mL
Oxycodone/Oxymorphone, Urine: NEGATIVE ng/mL
PCP Quant, Ur: NEGATIVE ng/mL

## 2019-11-29 LAB — CANNABINOID CONFIRMATION, UR
CANNABINOIDS: POSITIVE — AB
Carboxy THC GC/MS Conf: 42 ng/mL

## 2019-12-21 ENCOUNTER — Ambulatory Visit: Payer: 59

## 2019-12-22 NOTE — L&D Delivery Note (Signed)
Delivery Summary for Alvis Lemmings  Labor Events:   Preterm labor: No data found  Rupture date: 04/02/2020  Rupture time: 12:52 AM  Rupture type: Spontaneous  Fluid Color: Moderate Meconium  Induction: No data found  Augmentation: No data found  Complications: No data found  Cervical ripening: No data found No data found   No data found     Delivery:   Episiotomy: No data found  Lacerations: No data found  Repair suture: No data found  Repair # of packets: No data found  Blood loss (ml): 605   Information for the patient's newborn:  Amor, Hyle [[id number]]    Delivery 04/02/2020 1:01 AM by  Vaginal, Spontaneous Sex:  female Gestational Age: [redacted]w[redacted]d Delivery Clinician:   Living?:         APGARS  One minute Five minutes Ten minutes  Skin color:        Heart rate:        Grimace:        Muscle tone:        Breathing:        Totals: 8  9      Presentation/position:      Resuscitation:   Cord information:    Disposition of cord blood:     Blood gases sent?  Complications:   Placenta: Delivered:       appearance Newborn Measurements: Weight: 5 lb 0.1 oz (2270 g)  Height:    Head circumference:    Chest circumference:    Other providers:    Additional  information: Forceps:   Vacuum:   Breech:   Observed anomalies        Delivery Note At 1:01 AM a viable and healthy female was delivered via Vaginal, Spontaneous (Presentation: Vertex; ROA position).  APGAR: 8, 9; weight 2270 grams.  Moderate meconium stained fluid present just prior to delivery with spontaneous rupture of forebag. Placenta status: Spontaneous, Intact. Overall appeared normal in light of history of circumvallate placenta noted on prior ultrasounds.. Cord: 3-vessel with the following complications: None.  Delayed cord clamping observed.  Cord pH: not obtained.  Cord blood obtained for Rh negative status.  Will send placenta for evaluation.   Anesthesia: None Episiotomy: None Lacerations:  None Suture Repair: None Est. Blood Loss (mL): 605  Mom to postpartum.  Baby to Couplet care / Skin to Skin.  Hildred Laser, MD 04/02/2020, 1:32 AM

## 2019-12-25 ENCOUNTER — Other Ambulatory Visit: Payer: Self-pay

## 2019-12-25 ENCOUNTER — Encounter: Payer: Self-pay | Admitting: Family Medicine

## 2019-12-25 ENCOUNTER — Ambulatory Visit: Payer: 59 | Admitting: Family Medicine

## 2019-12-25 VITALS — BP 113/68 | HR 86 | Temp 98.1°F | Wt 128.4 lb

## 2019-12-25 DIAGNOSIS — F1210 Cannabis abuse, uncomplicated: Secondary | ICD-10-CM

## 2019-12-25 DIAGNOSIS — Z3400 Encounter for supervision of normal first pregnancy, unspecified trimester: Secondary | ICD-10-CM

## 2019-12-25 DIAGNOSIS — Z3402 Encounter for supervision of normal first pregnancy, second trimester: Secondary | ICD-10-CM

## 2019-12-25 DIAGNOSIS — O26891 Other specified pregnancy related conditions, first trimester: Secondary | ICD-10-CM

## 2019-12-25 DIAGNOSIS — Z6791 Unspecified blood type, Rh negative: Secondary | ICD-10-CM

## 2019-12-25 DIAGNOSIS — B9689 Other specified bacterial agents as the cause of diseases classified elsewhere: Secondary | ICD-10-CM

## 2019-12-25 DIAGNOSIS — F17200 Nicotine dependence, unspecified, uncomplicated: Secondary | ICD-10-CM

## 2019-12-25 DIAGNOSIS — F419 Anxiety disorder, unspecified: Secondary | ICD-10-CM

## 2019-12-25 DIAGNOSIS — N898 Other specified noninflammatory disorders of vagina: Secondary | ICD-10-CM

## 2019-12-25 MED ORDER — CLOTRIMAZOLE 1 % VA CREA: 1.0000 | TOPICAL_CREAM | Freq: Every day | VAGINAL | 0 refills | Status: DC

## 2019-12-25 MED ORDER — METRONIDAZOLE 250 MG PO TABS: 250.0000 mg | ORAL_TABLET | Freq: Three times a day (TID) | ORAL | 0 refills | Status: AC

## 2019-12-25 NOTE — Progress Notes (Signed)
In for visit; denies hospital visits, taking PNV; reports was late for u/s and was rescheduled to 12/28/19; c/o discharge with vaginal itching x 2-3 wks. And noticed "scratch" on labia Sharlette Dense, RN  Wet prep reviewed-+Yeast & BV treated Sharlette Dense, RN
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
## 2019-12-26 LAB — WET PREP FOR TRICH, YEAST, CLUE: Trichomonas Exam: NEGATIVE

## 2019-12-27 LAB — CHLAMYDIA/GC NAA, CONFIRMATION
Chlamydia trachomatis, NAA: NEGATIVE
Neisseria gonorrhoeae, NAA: NEGATIVE

## 2020-01-09 ENCOUNTER — Encounter: Payer: Self-pay | Admitting: Family Medicine

## 2020-01-09 DIAGNOSIS — O43119 Circumvallate placenta, unspecified trimester: Secondary | ICD-10-CM | POA: Insufficient documentation

## 2020-01-15 ENCOUNTER — Ambulatory Visit: Payer: 59 | Admitting: Family Medicine

## 2020-01-15 ENCOUNTER — Other Ambulatory Visit: Payer: Self-pay

## 2020-01-15 ENCOUNTER — Encounter: Payer: Self-pay | Admitting: Family Medicine

## 2020-01-15 VITALS — BP 107/59 | HR 90 | Temp 97.9°F | Wt 129.4 lb

## 2020-01-15 DIAGNOSIS — O26859 Spotting complicating pregnancy, unspecified trimester: Secondary | ICD-10-CM

## 2020-01-15 DIAGNOSIS — Z3402 Encounter for supervision of normal first pregnancy, second trimester: Secondary | ICD-10-CM

## 2020-01-15 DIAGNOSIS — Z3400 Encounter for supervision of normal first pregnancy, unspecified trimester: Secondary | ICD-10-CM
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
## 2020-01-17 ENCOUNTER — Ambulatory Visit: Payer: 59 | Attending: Internal Medicine

## 2020-01-17 DIAGNOSIS — Z20822 Contact with and (suspected) exposure to covid-19: Secondary | ICD-10-CM

## 2020-01-19 LAB — NOVEL CORONAVIRUS, NAA: SARS-CoV-2, NAA: NOT DETECTED

## 2020-01-22 ENCOUNTER — Ambulatory Visit: Payer: 59

## 2020-02-08 ENCOUNTER — Telehealth: Payer: Self-pay

## 2020-02-08 NOTE — Telephone Encounter (Signed)
Attempted to call-needs f/u appt. Next week with 28wk. Labs/Rhophylac; left voicemail message Sharlette Dense, RN

## 2020-02-13 ENCOUNTER — Encounter: Payer: Self-pay | Admitting: Emergency Medicine

## 2020-02-13 ENCOUNTER — Emergency Department
Admission: EM | Admit: 2020-02-13 | Discharge: 2020-02-13 | Disposition: A | Discharge status: Home / Self Care | Payer: Medicaid Other | Attending: Emergency Medicine | Admitting: Emergency Medicine

## 2020-02-13 ENCOUNTER — Other Ambulatory Visit: Payer: Self-pay

## 2020-02-13 DIAGNOSIS — Z62810 Personal history of physical and sexual abuse in childhood: Secondary | ICD-10-CM

## 2020-02-13 DIAGNOSIS — Z3A27 27 weeks gestation of pregnancy: Secondary | ICD-10-CM | POA: Diagnosis not present

## 2020-02-13 DIAGNOSIS — F17210 Nicotine dependence, cigarettes, uncomplicated: Secondary | ICD-10-CM | POA: Diagnosis not present

## 2020-02-13 DIAGNOSIS — R825 Elevated urine levels of drugs, medicaments and biological substances: Secondary | ICD-10-CM | POA: Diagnosis present

## 2020-02-13 DIAGNOSIS — Z91410 Personal history of adult physical and sexual abuse: Secondary | ICD-10-CM

## 2020-02-13 DIAGNOSIS — O23592 Infection of other part of genital tract in pregnancy, second trimester: Secondary | ICD-10-CM | POA: Insufficient documentation

## 2020-02-13 DIAGNOSIS — O99322 Drug use complicating pregnancy, second trimester: Secondary | ICD-10-CM | POA: Insufficient documentation

## 2020-02-13 DIAGNOSIS — Z6791 Unspecified blood type, Rh negative: Secondary | ICD-10-CM

## 2020-02-13 DIAGNOSIS — B9689 Other specified bacterial agents as the cause of diseases classified elsewhere: Secondary | ICD-10-CM | POA: Insufficient documentation

## 2020-02-13 DIAGNOSIS — O99342 Other mental disorders complicating pregnancy, second trimester: Secondary | ICD-10-CM | POA: Insufficient documentation

## 2020-02-13 DIAGNOSIS — F3132 Bipolar disorder, current episode depressed, moderate: Secondary | ICD-10-CM

## 2020-02-13 DIAGNOSIS — Z3402 Encounter for supervision of normal first pregnancy, second trimester: Secondary | ICD-10-CM

## 2020-02-13 DIAGNOSIS — Z046 Encounter for general psychiatric examination, requested by authority: Secondary | ICD-10-CM | POA: Diagnosis present

## 2020-02-13 DIAGNOSIS — F322 Major depressive disorder, single episode, severe without psychotic features: Secondary | ICD-10-CM | POA: Diagnosis present

## 2020-02-13 DIAGNOSIS — F6089 Other specific personality disorders: Secondary | ICD-10-CM | POA: Diagnosis present

## 2020-02-13 DIAGNOSIS — F419 Anxiety disorder, unspecified: Secondary | ICD-10-CM | POA: Diagnosis present

## 2020-02-13 DIAGNOSIS — F1410 Cocaine abuse, uncomplicated: Secondary | ICD-10-CM | POA: Diagnosis present

## 2020-02-13 DIAGNOSIS — F1210 Cannabis abuse, uncomplicated: Secondary | ICD-10-CM | POA: Insufficient documentation

## 2020-02-13 DIAGNOSIS — O43119 Circumvallate placenta, unspecified trimester: Secondary | ICD-10-CM | POA: Diagnosis present

## 2020-02-13 DIAGNOSIS — F1414 Cocaine abuse with cocaine-induced mood disorder: Secondary | ICD-10-CM | POA: Diagnosis not present

## 2020-02-13 DIAGNOSIS — Z79899 Other long term (current) drug therapy: Secondary | ICD-10-CM | POA: Diagnosis not present

## 2020-02-13 DIAGNOSIS — F1110 Opioid abuse, uncomplicated: Secondary | ICD-10-CM | POA: Diagnosis present

## 2020-02-13 DIAGNOSIS — F988 Other specified behavioral and emotional disorders with onset usually occurring in childhood and adolescence: Secondary | ICD-10-CM | POA: Diagnosis present

## 2020-02-13 DIAGNOSIS — F3160 Bipolar disorder, current episode mixed, unspecified: Secondary | ICD-10-CM | POA: Diagnosis present

## 2020-02-13 DIAGNOSIS — F1510 Other stimulant abuse, uncomplicated: Secondary | ICD-10-CM | POA: Diagnosis present

## 2020-02-13 DIAGNOSIS — T1491XA Suicide attempt, initial encounter: Secondary | ICD-10-CM | POA: Diagnosis present

## 2020-02-13 DIAGNOSIS — F1010 Alcohol abuse, uncomplicated: Secondary | ICD-10-CM | POA: Diagnosis present

## 2020-02-13 DIAGNOSIS — F17200 Nicotine dependence, unspecified, uncomplicated: Secondary | ICD-10-CM | POA: Diagnosis present

## 2020-02-13 LAB — COMPREHENSIVE METABOLIC PANEL
ALT: 21 U/L (ref 0–44)
AST: 20 U/L (ref 15–41)
Albumin: 3.1 g/dL — ABNORMAL LOW (ref 3.5–5.0)
Alkaline Phosphatase: 118 U/L (ref 38–126)
Anion gap: 9 (ref 5–15)
BUN: 7 mg/dL (ref 6–20)
CO2: 24 mmol/L (ref 22–32)
Calcium: 9.1 mg/dL (ref 8.9–10.3)
Chloride: 104 mmol/L (ref 98–111)
Creatinine, Ser: 0.46 mg/dL (ref 0.44–1.00)
GFR calc Af Amer: >60 mL/min (ref >60)
GFR calc non Af Amer: >60 mL/min (ref >60)
Glucose, Bld: 92 mg/dL (ref 70–99)
Potassium: 3.4 mmol/L — ABNORMAL LOW (ref 3.5–5.1)
Sodium: 137 mmol/L (ref 135–145)
Total Bilirubin: 0.8 mg/dL (ref 0.3–1.2)
Total Protein: 6.8 g/dL (ref 6.5–8.1)

## 2020-02-13 LAB — URINE DRUG SCREEN, QUALITATIVE (ARMC ONLY)
Amphetamines, Ur Screen: NOT DETECTED
Barbiturates, Ur Screen: NOT DETECTED
Benzodiazepine, Ur Scrn: NOT DETECTED
Cannabinoid 50 Ng, Ur ~~LOC~~: POSITIVE — AB
Cocaine Metabolite,Ur ~~LOC~~: POSITIVE — AB
MDMA (Ecstasy)Ur Screen: NOT DETECTED
Methadone Scn, Ur: NOT DETECTED
Opiate, Ur Screen: NOT DETECTED
Phencyclidine (PCP) Ur S: NOT DETECTED
Tricyclic, Ur Screen: NOT DETECTED

## 2020-02-13 LAB — URINALYSIS, COMPLETE (UACMP) WITH MICROSCOPIC
Bacteria, UA: NONE SEEN
Bilirubin Urine: NEGATIVE
Glucose, UA: NEGATIVE mg/dL
Hgb urine dipstick: NEGATIVE
Ketones, ur: 20 mg/dL — AB
Nitrite: NEGATIVE
Protein, ur: 30 mg/dL — AB
Specific Gravity, Urine: 1.024 (ref 1.005–1.030)
pH: 5 (ref 5.0–8.0)

## 2020-02-13 LAB — CBC
HCT: 36.4 % (ref 36.0–46.0)
HCT: 36.6 % (ref 36.0–46.0)
Hemoglobin: 12.7 g/dL (ref 12.0–15.0)
Hemoglobin: 12.8 g/dL (ref 12.0–15.0)
MCH: 33 pg (ref 26.0–34.0)
MCH: 33.2 pg (ref 26.0–34.0)
MCHC: 34.9 g/dL (ref 30.0–36.0)
MCHC: 35 g/dL (ref 30.0–36.0)
MCV: 94.5 fL (ref 80.0–100.0)
MCV: 94.8 fL (ref 80.0–100.0)
Platelets: 309 10*3/uL (ref 150–400)
Platelets: 327 10*3/uL (ref 150–400)
RBC: 3.85 MIL/uL — ABNORMAL LOW (ref 3.87–5.11)
RBC: 3.86 MIL/uL — ABNORMAL LOW (ref 3.87–5.11)
RDW: 12.1 % (ref 11.5–15.5)
RDW: 12.3 % (ref 11.5–15.5)
WBC: 20.9 10*3/uL — ABNORMAL HIGH (ref 4.0–10.5)
WBC: 21.1 10*3/uL — ABNORMAL HIGH (ref 4.0–10.5)
nRBC: 0 % (ref 0.0–0.2)
nRBC: 0 % (ref 0.0–0.2)

## 2020-02-13 LAB — CBC WITH DIFFERENTIAL/PLATELET
Abs Immature Granulocytes: 0.16 10*3/uL — ABNORMAL HIGH (ref 0.00–0.07)
Basophils Absolute: 0 10*3/uL (ref 0.0–0.1)
Basophils Relative: 0 %
Eosinophils Absolute: 0.1 10*3/uL (ref 0.0–0.5)
Eosinophils Relative: 1 %
HCT: 36.9 % (ref 36.0–46.0)
Hemoglobin: 12.7 g/dL (ref 12.0–15.0)
Immature Granulocytes: 1 %
Lymphocytes Relative: 11 %
Lymphs Abs: 2.4 10*3/uL (ref 0.7–4.0)
MCH: 32.6 pg (ref 26.0–34.0)
MCHC: 34.4 g/dL (ref 30.0–36.0)
MCV: 94.9 fL (ref 80.0–100.0)
Monocytes Absolute: 1.7 10*3/uL — ABNORMAL HIGH (ref 0.1–1.0)
Monocytes Relative: 8 %
Neutro Abs: 16.8 10*3/uL — ABNORMAL HIGH (ref 1.7–7.7)
Neutrophils Relative %: 79 %
Platelets: 311 10*3/uL (ref 150–400)
RBC: 3.89 MIL/uL (ref 3.87–5.11)
RDW: 12.2 % (ref 11.5–15.5)
WBC: 21.2 10*3/uL — ABNORMAL HIGH (ref 4.0–10.5)
nRBC: 0 % (ref 0.0–0.2)

## 2020-02-13 LAB — CHLAMYDIA/NGC RT PCR (ARMC ONLY)
Chlamydia Tr: NOT DETECTED
N gonorrhoeae: NOT DETECTED

## 2020-02-13 LAB — WET PREP, GENITAL
Sperm: NONE SEEN
Trich, Wet Prep: NONE SEEN
Yeast Wet Prep HPF POC: NONE SEEN

## 2020-02-13 LAB — ETHANOL: Alcohol, Ethyl (B): <10 mg/dL (ref <10)

## 2020-02-13 LAB — SALICYLATE LEVEL: Salicylate Lvl: <7 mg/dL — ABNORMAL LOW (ref 7.0–30.0)

## 2020-02-13 LAB — ACETAMINOPHEN LEVEL: Acetaminophen (Tylenol), Serum: <10 ug/mL — ABNORMAL LOW (ref 10–30)

## 2020-02-13 MED ORDER — METRONIDAZOLE 500 MG PO TABS: 500.0000 mg | ORAL_TABLET | Freq: Two times a day (BID) | ORAL | 0 refills | Status: DC

## 2020-02-13 MED ORDER — FOSFOMYCIN TROMETHAMINE 3 G PO PACK
3.0000 g | PACK | Freq: Once | ORAL | Status: AC
  Administered 2020-02-13: 3 g via ORAL
  Filled 2020-02-13: qty 3

## 2020-02-13 NOTE — Discharge Instructions (Addendum)
1.  Take antibiotic as prescribed (Flagyl 500 mg twice daily x7 days). 2.  Stop using illicit drugs.  This can cause preterm labor and permanent harm to your baby. 3.  Return to the ER for worsening symptoms, persistent vomiting, difficulty breathing, feelings of hurting yourself or others, or other concerns.

## 2020-02-13 NOTE — ED Notes (Signed)
Fetal Heart Rate was around 155 bpm.

## 2020-02-13 NOTE — ED Notes (Signed)
Pt dressed into hospital appropriate scrubs by this RN and Melody, EDT.  Clothes placed into 2 belongings bag.  Contents include: 1 smart phone, 1 grey jacket, 1 light grey jacket, 1 black zip up, 1 pair grey shoes, 1 pair pink pants, 1 pair ear rings, 1 eye brown piercing, 1 lip ring, 1 hair tie, 1 nose ring, 1 grey ring, 1 necklace, 1 pair black socks, underware, and sports bra.

## 2020-02-13 NOTE — ED Notes (Signed)
Pt is eating. Pt appears to be in a good mood and is smiling and talking.
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
## 2020-02-13 NOTE — ED Notes (Signed)
Pt currently speaking to psychiatrist.
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
## 2020-02-13 NOTE — Consult Note (Signed)
  Patient is a 19 year old female who was brought in with complaints of suicidal statements.  Upon arrival to the hospital patient denied feeling suicidal and reported that this was said in the context of an argument.  At this time patient does not meet criteria for inpatient hospitalization and will be discharged.  IVC rescinded patient has appropriate outpatient follow-up and contracts for safety.  I agree with assessment written by NP  Disposition: Discharge

## 2020-02-13 NOTE — ED Notes (Signed)
Pt verbalized understanding of discharge instructions. NAD at this time. 

## 2020-02-13 NOTE — ED Notes (Signed)
This RN is taking pt to room 01 to do fetal heart tones and have pt swabs done.

## 2020-02-13 NOTE — ED Triage Notes (Signed)
Pt to ED with BPD under IVC.  States got into an argument with "baby daddy" and in the moment states she would kill herself and if that would make him happy.  Patient denies HI and denies intention of harming self.  Denies alcohol or drug use.  Patient is A&Ox4, calm and cooperative in triage, in NAD at this time.  Pt states also is [redacted] weeks pregnant.

## 2020-02-14 NOTE — Telephone Encounter (Signed)
Attempted to schedule f/u office visit; left message with appt. # and that is due labs & Rhophylac injection Sharlette Dense, RN

## 2020-02-20 NOTE — Telephone Encounter (Signed)
Attempted to reschedule-left message re: needs Rhophylac & to call to reschedule Sharlette Dense, RN

## 2020-02-23 ENCOUNTER — Ambulatory Visit: Payer: Self-pay

## 2020-02-27 ENCOUNTER — Ambulatory Visit: Payer: Medicaid Other | Admitting: Family Medicine

## 2020-02-27 ENCOUNTER — Other Ambulatory Visit: Payer: Self-pay

## 2020-02-27 VITALS — BP 119/68 | HR 83 | Temp 98.6°F | Wt 131.0 lb

## 2020-02-27 DIAGNOSIS — T1491XA Suicide attempt, initial encounter: Secondary | ICD-10-CM

## 2020-02-27 DIAGNOSIS — Z3402 Encounter for supervision of normal first pregnancy, second trimester: Secondary | ICD-10-CM

## 2020-02-27 DIAGNOSIS — O43112 Circumvallate placenta, second trimester: Secondary | ICD-10-CM

## 2020-02-27 DIAGNOSIS — F1210 Cannabis abuse, uncomplicated: Secondary | ICD-10-CM

## 2020-02-27 DIAGNOSIS — F17200 Nicotine dependence, unspecified, uncomplicated: Secondary | ICD-10-CM

## 2020-02-27 DIAGNOSIS — F322 Major depressive disorder, single episode, severe without psychotic features: Secondary | ICD-10-CM

## 2020-02-27 DIAGNOSIS — O26899 Other specified pregnancy related conditions, unspecified trimester: Secondary | ICD-10-CM

## 2020-02-27 DIAGNOSIS — F1110 Opioid abuse, uncomplicated: Secondary | ICD-10-CM

## 2020-02-27 DIAGNOSIS — F3160 Bipolar disorder, current episode mixed, unspecified: Secondary | ICD-10-CM

## 2020-02-27 DIAGNOSIS — Z6791 Unspecified blood type, Rh negative: Secondary | ICD-10-CM

## 2020-02-27 DIAGNOSIS — Z3400 Encounter for supervision of normal first pregnancy, unspecified trimester: Secondary | ICD-10-CM

## 2020-02-27 DIAGNOSIS — O26891 Other specified pregnancy related conditions, first trimester: Secondary | ICD-10-CM

## 2020-02-27 DIAGNOSIS — F1410 Cocaine abuse, uncomplicated: Secondary | ICD-10-CM

## 2020-02-27 LAB — OB RESULTS CONSOLE HIV ANTIBODY (ROUTINE TESTING): HIV: NONREACTIVE

## 2020-02-27 MED ORDER — RHO D IMMUNE GLOBULIN 1500 UNIT/2ML IJ SOSY
300.0000 ug | PREFILLED_SYRINGE | Freq: Once | INTRAMUSCULAR | Status: AC
  Administered 2020-02-27: 300 ug via INTRAMUSCULAR
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
## 2020-02-27 NOTE — Progress Notes (Signed)
Here today for 29.6 week MH RV. Taking PNV QD, 02/13/20 [facility] ED visit. 28 week labs and Tdap today. Will need Rhogam after labs drawn. Tawny Hopping, RN

## 2020-02-28 ENCOUNTER — Encounter: Payer: Self-pay | Admitting: Family Medicine

## 2020-02-28 LAB — HEPATITIS C ANTIBODY: Hep C Virus Ab: <0.1 s/co ratio (ref 0.0–0.9)

## 2020-02-28 LAB — HEMOGLOBIN, FINGERSTICK: Hemoglobin: 13 g/dL (ref 11.1–15.9)

## 2020-02-28 LAB — GLUCOSE, 1 HOUR GESTATIONAL: Gestational Diabetes Screen: 118 mg/dL (ref 65–139)

## 2020-02-28 LAB — RPR: RPR Ser Ql: NONREACTIVE

## 2020-02-29 LAB — CHLAMYDIA/GC NAA, CONFIRMATION
Chlamydia trachomatis, NAA: NEGATIVE
Neisseria gonorrhoeae, NAA: NEGATIVE

## 2020-03-01 LAB — 789231 7+OXYCODONE-BUND
Amphetamines, Urine: NEGATIVE ng/mL
BENZODIAZ UR QL: NEGATIVE ng/mL
Barbiturate screen, urine: NEGATIVE ng/mL
Cocaine (Metab.): NEGATIVE ng/mL
OPIATE SCREEN URINE: NEGATIVE ng/mL
Oxycodone/Oxymorphone, Urine: NEGATIVE ng/mL
PCP Quant, Ur: NEGATIVE ng/mL

## 2020-03-01 LAB — CANNABINOID CONFIRMATION, UR
CANNABINOIDS: POSITIVE — AB
Carboxy THC GC/MS Conf: 32 ng/mL

## 2020-03-12 ENCOUNTER — Ambulatory Visit: Payer: Self-pay

## 2020-03-18 ENCOUNTER — Ambulatory Visit: Payer: Self-pay

## 2020-03-18 ENCOUNTER — Telehealth: Payer: Self-pay

## 2020-03-18 NOTE — Telephone Encounter (Signed)
Missed appt today; attempted to reschedule-no answer, left voicemail message Sharlette Dense, RN

## 2020-03-19 NOTE — Telephone Encounter (Signed)
Call to client at mobile phone number as needs [facility] RV appt and call was disconnected when RN identified self. Return call to number and left message requesting client reschedule missed [facility] RV appt. Number to call provided. Call to emergency contact and left messge requesting assistance contacting client to call for maternity clinic appt. Jossie Ng, RN

## 2020-03-25 NOTE — Telephone Encounter (Signed)
Call to client's cell number and answered by female asking "Who is this?" RN identified self by name and calling from [facility]. RN asked to speak to [place] and female said, "just a minute'" Silence then heard on phone and no one returned to phone. Re-called number and left message requesting client schedule an appt in [facility] and number to call provided. Jossie Ng, RN

## 2020-03-28 DIAGNOSIS — O09893 Supervision of other high risk pregnancies, third trimester: Secondary | ICD-10-CM | POA: Insufficient documentation

## 2020-03-28 DIAGNOSIS — Z91199 Patient's noncompliance with other medical treatment and regimen due to unspecified reason: Secondary | ICD-10-CM | POA: Insufficient documentation

## 2020-03-28 NOTE — Telephone Encounter (Signed)
Attempted again to reschedule office visit; left voicemail message Sharlette Dense, RN

## 2020-04-01 ENCOUNTER — Encounter: Payer: Self-pay | Admitting: Obstetrics and Gynecology

## 2020-04-01 ENCOUNTER — Observation Stay: Payer: Medicaid Other

## 2020-04-01 ENCOUNTER — Inpatient Hospital Stay
Admission: EM | Admit: 2020-04-01 | Discharge: 2020-04-04 | DRG: 806 | Disposition: A | Discharge status: Home / Self Care | Payer: Medicaid Other | Attending: Obstetrics and Gynecology | Admitting: Obstetrics and Gynecology

## 2020-04-01 DIAGNOSIS — F17210 Nicotine dependence, cigarettes, uncomplicated: Secondary | ICD-10-CM | POA: Diagnosis present

## 2020-04-01 DIAGNOSIS — O43113 Circumvallate placenta, third trimester: Secondary | ICD-10-CM | POA: Diagnosis present

## 2020-04-01 DIAGNOSIS — F1210 Cannabis abuse, uncomplicated: Secondary | ICD-10-CM | POA: Diagnosis present

## 2020-04-01 DIAGNOSIS — F3160 Bipolar disorder, current episode mixed, unspecified: Secondary | ICD-10-CM | POA: Diagnosis present

## 2020-04-01 DIAGNOSIS — O9902 Anemia complicating childbirth: Secondary | ICD-10-CM | POA: Diagnosis present

## 2020-04-01 DIAGNOSIS — R109 Unspecified abdominal pain: Secondary | ICD-10-CM | POA: Diagnosis present

## 2020-04-01 DIAGNOSIS — F1410 Cocaine abuse, uncomplicated: Secondary | ICD-10-CM | POA: Diagnosis present

## 2020-04-01 DIAGNOSIS — O26891 Other specified pregnancy related conditions, first trimester: Secondary | ICD-10-CM

## 2020-04-01 DIAGNOSIS — O42913 Preterm premature rupture of membranes, unspecified as to length of time between rupture and onset of labor, third trimester: Secondary | ICD-10-CM | POA: Diagnosis present

## 2020-04-01 DIAGNOSIS — T1491XA Suicide attempt, initial encounter: Secondary | ICD-10-CM | POA: Diagnosis present

## 2020-04-01 DIAGNOSIS — Z9119 Patient's noncompliance with other medical treatment and regimen: Secondary | ICD-10-CM

## 2020-04-01 DIAGNOSIS — Z3400 Encounter for supervision of normal first pregnancy, unspecified trimester: Secondary | ICD-10-CM | POA: Diagnosis not present

## 2020-04-01 DIAGNOSIS — Z62810 Personal history of physical and sexual abuse in childhood: Secondary | ICD-10-CM

## 2020-04-01 DIAGNOSIS — O26893 Other specified pregnancy related conditions, third trimester: Secondary | ICD-10-CM | POA: Diagnosis present

## 2020-04-01 DIAGNOSIS — Z6791 Unspecified blood type, Rh negative: Secondary | ICD-10-CM

## 2020-04-01 DIAGNOSIS — Z91410 Personal history of adult physical and sexual abuse: Secondary | ICD-10-CM

## 2020-04-01 DIAGNOSIS — Z3A49 Greater than 42 weeks gestation of pregnancy: Secondary | ICD-10-CM | POA: Diagnosis not present

## 2020-04-01 DIAGNOSIS — O99334 Smoking (tobacco) complicating childbirth: Secondary | ICD-10-CM | POA: Diagnosis present

## 2020-04-01 DIAGNOSIS — Z20822 Contact with and (suspected) exposure to covid-19: Secondary | ICD-10-CM | POA: Diagnosis present

## 2020-04-01 DIAGNOSIS — O09893 Supervision of other high risk pregnancies, third trimester: Secondary | ICD-10-CM

## 2020-04-01 DIAGNOSIS — O99324 Drug use complicating childbirth: Secondary | ICD-10-CM | POA: Diagnosis present

## 2020-04-01 DIAGNOSIS — F322 Major depressive disorder, single episode, severe without psychotic features: Secondary | ICD-10-CM | POA: Diagnosis present

## 2020-04-01 DIAGNOSIS — D649 Anemia, unspecified: Secondary | ICD-10-CM | POA: Diagnosis present

## 2020-04-01 DIAGNOSIS — F17200 Nicotine dependence, unspecified, uncomplicated: Secondary | ICD-10-CM | POA: Diagnosis present

## 2020-04-01 DIAGNOSIS — O42013 Preterm premature rupture of membranes, onset of labor within 24 hours of rupture, third trimester: Secondary | ICD-10-CM | POA: Diagnosis not present

## 2020-04-01 DIAGNOSIS — F319 Bipolar disorder, unspecified: Secondary | ICD-10-CM | POA: Diagnosis not present

## 2020-04-01 DIAGNOSIS — F419 Anxiety disorder, unspecified: Secondary | ICD-10-CM | POA: Diagnosis present

## 2020-04-01 DIAGNOSIS — Z3A34 34 weeks gestation of pregnancy: Secondary | ICD-10-CM

## 2020-04-01 DIAGNOSIS — Z8659 Personal history of other mental and behavioral disorders: Secondary | ICD-10-CM | POA: Diagnosis not present

## 2020-04-01 DIAGNOSIS — Z91199 Patient's noncompliance with other medical treatment and regimen due to unspecified reason: Secondary | ICD-10-CM

## 2020-04-01 DIAGNOSIS — O43119 Circumvallate placenta, unspecified trimester: Secondary | ICD-10-CM | POA: Diagnosis present

## 2020-04-01 LAB — URINE DRUG SCREEN, QUALITATIVE (ARMC ONLY)
Amphetamines, Ur Screen: NOT DETECTED
Barbiturates, Ur Screen: NOT DETECTED
Benzodiazepine, Ur Scrn: NOT DETECTED
Cannabinoid 50 Ng, Ur ~~LOC~~: NOT DETECTED
Cocaine Metabolite,Ur ~~LOC~~: POSITIVE — AB
MDMA (Ecstasy)Ur Screen: NOT DETECTED
Methadone Scn, Ur: NOT DETECTED
Opiate, Ur Screen: NOT DETECTED
Phencyclidine (PCP) Ur S: NOT DETECTED
Tricyclic, Ur Screen: NOT DETECTED

## 2020-04-01 LAB — CBC WITH DIFFERENTIAL/PLATELET
Abs Immature Granulocytes: 0.06 10*3/uL (ref 0.00–0.07)
Basophils Absolute: 0 10*3/uL (ref 0.0–0.1)
Basophils Relative: 0 %
Eosinophils Absolute: 0.1 10*3/uL (ref 0.0–0.5)
Eosinophils Relative: 1 %
HCT: 39.3 % (ref 36.0–46.0)
Hemoglobin: 13.3 g/dL (ref 12.0–15.0)
Immature Granulocytes: 1 %
Lymphocytes Relative: 18 %
Lymphs Abs: 2.3 10*3/uL (ref 0.7–4.0)
MCH: 31.1 pg (ref 26.0–34.0)
MCHC: 33.8 g/dL (ref 30.0–36.0)
MCV: 92 fL (ref 80.0–100.0)
Monocytes Absolute: 1 10*3/uL (ref 0.1–1.0)
Monocytes Relative: 8 %
Neutro Abs: 8.9 10*3/uL — ABNORMAL HIGH (ref 1.7–7.7)
Neutrophils Relative %: 72 %
Platelets: 286 10*3/uL (ref 150–400)
RBC: 4.27 MIL/uL (ref 3.87–5.11)
RDW: 12.3 % (ref 11.5–15.5)
WBC: 12.3 10*3/uL — ABNORMAL HIGH (ref 4.0–10.5)
nRBC: 0 % (ref 0.0–0.2)

## 2020-04-01 LAB — RESPIRATORY PANEL BY RT PCR (FLU A&B, COVID)
Influenza A by PCR: NEGATIVE
Influenza B by PCR: NEGATIVE
SARS Coronavirus 2 by RT PCR: NEGATIVE

## 2020-04-01 LAB — GROUP B STREP BY PCR: Group B strep by PCR: NEGATIVE

## 2020-04-01 LAB — TYPE AND SCREEN
ABO/RH(D): O NEG
Antibody Screen: POSITIVE

## 2020-04-01 LAB — RUPTURE OF MEMBRANE (ROM)PLUS: Rom Plus: POSITIVE

## 2020-04-01 LAB — ABO/RH: ABO/RH(D): O NEG

## 2020-04-01 MED ORDER — LIDOCAINE HCL (PF) 1 % IJ SOLN
INTRAMUSCULAR | Status: AC
  Filled 2020-04-01: qty 30

## 2020-04-01 MED ORDER — BUTORPHANOL TARTRATE 1 MG/ML IJ SOLN
1.0000 mg | INTRAMUSCULAR | Status: DC | PRN
  Administered 2020-04-01 – 2020-04-02 (×3): 1 mg via INTRAVENOUS
  Filled 2020-04-01 (×3): qty 1

## 2020-04-01 MED ORDER — SODIUM CHLORIDE 0.9 % IV SOLN
2.0000 g | Freq: Once | INTRAVENOUS | Status: AC
  Administered 2020-04-01: 19:00:00 2 g via INTRAVENOUS
  Filled 2020-04-01: qty 2000

## 2020-04-01 MED ORDER — AMMONIA AROMATIC IN INHA
RESPIRATORY_TRACT | Status: AC
  Filled 2020-04-01: qty 10

## 2020-04-01 MED ORDER — TERBUTALINE SULFATE 1 MG/ML IJ SOLN: 0.2500 mg | Freq: Once | INTRAMUSCULAR | Status: AC

## 2020-04-01 MED ORDER — TERBUTALINE SULFATE 1 MG/ML IJ SOLN
INTRAMUSCULAR | Status: AC
  Administered 2020-04-01: 0.25 mg
  Filled 2020-04-01: qty 1

## 2020-04-01 MED ORDER — ACETAMINOPHEN 325 MG PO TABS: 650.0000 mg | ORAL_TABLET | ORAL | Status: DC | PRN

## 2020-04-01 MED ORDER — OXYTOCIN 40 UNITS IN NORMAL SALINE INFUSION - SIMPLE MED
1.0000 m[IU]/min | INTRAVENOUS | Status: DC
  Administered 2020-04-01: 22:00:00 2 m[IU]/min via INTRAVENOUS

## 2020-04-01 MED ORDER — TERBUTALINE SULFATE 1 MG/ML IJ SOLN: 0.2500 mg | Freq: Once | INTRAMUSCULAR | Status: DC | PRN

## 2020-04-01 MED ORDER — ONDANSETRON HCL 4 MG/2ML IJ SOLN: 4.0000 mg | Freq: Four times a day (QID) | INTRAMUSCULAR | Status: DC | PRN

## 2020-04-01 MED ORDER — BETAMETHASONE SOD PHOS & ACET 6 (3-3) MG/ML IJ SUSP
12.0000 mg | Freq: Once | INTRAMUSCULAR | Status: AC
  Administered 2020-04-01: 20:00:00 12 mg via INTRAMUSCULAR
  Filled 2020-04-01: qty 5

## 2020-04-01 MED ORDER — OXYCODONE-ACETAMINOPHEN 5-325 MG PO TABS: 2.0000 | ORAL_TABLET | ORAL | Status: DC | PRN

## 2020-04-01 MED ORDER — OXYTOCIN 10 UNIT/ML IJ SOLN
INTRAMUSCULAR | Status: AC
  Filled 2020-04-01: qty 2

## 2020-04-01 MED ORDER — OXYTOCIN BOLUS FROM INFUSION
500.0000 mL | Freq: Once | INTRAVENOUS | Status: DC
  Administered 2020-04-02: 04:00:00 500 mL via INTRAVENOUS

## 2020-04-01 MED ORDER — SOD CITRATE-CITRIC ACID 500-334 MG/5ML PO SOLN: 30.0000 mL | ORAL | Status: DC | PRN

## 2020-04-01 MED ORDER — OXYTOCIN 40 UNITS IN NORMAL SALINE INFUSION - SIMPLE MED
2.5000 [IU]/h | INTRAVENOUS | Status: DC
  Administered 2020-04-02: 2.5 [IU]/h via INTRAVENOUS
  Filled 2020-04-01: qty 1000

## 2020-04-01 MED ORDER — LIDOCAINE HCL (PF) 1 % IJ SOLN: 30.0000 mL | INTRAMUSCULAR | Status: DC | PRN

## 2020-04-01 MED ORDER — MISOPROSTOL 200 MCG PO TABS
ORAL_TABLET | ORAL | Status: AC
  Filled 2020-04-01: qty 4

## 2020-04-01 MED ORDER — LACTATED RINGERS IV SOLN: INTRAVENOUS | Status: DC

## 2020-04-01 MED ORDER — LACTATED RINGERS IV SOLN: 500.0000 mL | INTRAVENOUS | Status: DC | PRN

## 2020-04-01 MED ORDER — SODIUM CHLORIDE 0.9 % IV SOLN: 1.0000 g | INTRAVENOUS | Status: DC

## 2020-04-01 MED ORDER — OXYCODONE-ACETAMINOPHEN 5-325 MG PO TABS: 1.0000 | ORAL_TABLET | ORAL | Status: DC | PRN

## 2020-04-01 NOTE — Progress Notes (Signed)
Intrapartum Progress Note  S: Patient sleeping in room, mildly aware of contractions, wincing.   O: Blood pressure 127/75, pulse 66, temperature 97.7 F (36.5 C), temperature source Oral, resp. rate 18, height 5\' 3"  (1.6 m), weight 63.5 kg, last menstrual period 07/22/2019. Gen App: NAD, comfortable Abdomen: soft, gravid FHT: baseline 145 bpm.  Accels absent.  Decels absent. moderate in degree variability.   Tocometer: contractions q 1-4 minutes Cervix: exam deferred Extremities: Nontender, no edema.  Pitocin: 4 mIU.   Labs:   Results for orders placed or performed during the hospital encounter of 04/01/20  Respiratory Panel by RT PCR (Flu A&B, Covid) - Nasopharyngeal Swab   Specimen: Nasopharyngeal Swab  Result Value Ref Range   SARS Coronavirus 2 by RT PCR NEGATIVE NEGATIVE   Influenza A by PCR NEGATIVE NEGATIVE   Influenza B by PCR NEGATIVE NEGATIVE  Group B strep by PCR   Specimen: Vaginal/Rectal; Genital  Result Value Ref Range   Group B strep by PCR NEGATIVE NEGATIVE  ROM Plus ([facility] only)  Result Value Ref Range   Rom Plus POSITIVE   CBC with Differential/Platelet  Result Value Ref Range   WBC 12.3 (H) 4.0 - 10.5 K/uL   RBC 4.27 3.87 - 5.11 MIL/uL   Hemoglobin 13.3 12.0 - 15.0 g/dL   HCT 06/01/20 34.7 - 42.5 %   MCV 92.0 80.0 - 100.0 fL   MCH 31.1 26.0 - 34.0 pg   MCHC 33.8 30.0 - 36.0 g/dL   RDW 95.6 38.7 - 56.4 %   Platelets 286 150 - 400 K/uL   nRBC 0.0 0.0 - 0.2 %   Neutrophils Relative % 72 %   Neutro Abs 8.9 (H) 1.7 - 7.7 K/uL   Lymphocytes Relative 18 %   Lymphs Abs 2.3 0.7 - 4.0 K/uL   Monocytes Relative 8 %   Monocytes Absolute 1.0 0.1 - 1.0 K/uL   Eosinophils Relative 1 %   Eosinophils Absolute 0.1 0.0 - 0.5 K/uL   Basophils Relative 0 %   Basophils Absolute 0.0 0.0 - 0.1 K/uL   Immature Granulocytes 1 %   Abs Immature Granulocytes 0.06 0.00 - 0.07 K/uL  Urine Drug Screen, Qualitative ([facility] only)  Result Value Ref Range   Tricyclic, Ur Screen NONE  DETECTED NONE DETECTED   Amphetamines, Ur Screen NONE DETECTED NONE DETECTED   MDMA (Ecstasy)Ur Screen NONE DETECTED NONE DETECTED   Cocaine Metabolite,Ur [place] POSITIVE (A) NONE DETECTED   Opiate, Ur Screen NONE DETECTED NONE DETECTED   Phencyclidine (PCP) Ur S NONE DETECTED NONE DETECTED   Cannabinoid 50 Ng, Ur [place] NONE DETECTED NONE DETECTED   Barbiturates, Ur Screen NONE DETECTED NONE DETECTED   Benzodiazepine, Ur Scrn NONE DETECTED NONE DETECTED   Methadone Scn, Ur NONE DETECTED NONE DETECTED  Type and screen [facility]  Result Value Ref Range   ABO/RH(D) O NEG    Antibody Screen POS    Sample Expiration 04/04/2020,2359    Antibody Identification      PASSIVELY ACQUIRED ANTI-D Performed at [facility], [address] [place], [place]   ABO/Rh  Result Value Ref Range   ABO/RH(D)      O NEG Performed at [facility], [address], [place], [place]     Assessment:  1: SIUP at [redacted]w[redacted]d 2. PPROM @ 34.5 weeks 3. History of substance abuse 4. Circumvallate placenta 5. GBS negative  Plan:  1. Patient with only small cervical change  noted per last 2 exams with contractions spacing. Initiated pitocin per protocol. Will space cervical exams to every 4 hrs unless patient showing signs of imminent delivery due to PPROM status. 2. Category I tracing (although absent accelerations at this time).  3. Pain management per IV meds as she cannot receive an epidural due to positive drug screen on admission.  4. GBS negative, can discontinue antibiotics at this time.  5. Continue to monitor.     Rubie Maid, MD 04/01/2020 10:56 PM

## 2020-04-01 NOTE — OB Triage Note (Signed)
Pt G1P0 [redacted]w[redacted]d presents to the ER via EMS for c/o CTX that started about an hour ago and have intensified. EMS reports pt ROM. Pt reports she is a [facility] pt. No complications this pregnancy. Reports no recent drug or alcohol use. Current smoker. Upon arrival to unit pt writhing in pain and not able to answer questions. SVE preformed by another Char[employer] fundraiser. No immanent delivery. Monitors applied when able to get patient to calm down. In and out cath preformed by another RN. No urine. Bladder scan showed 75 ml. Lab orders placed. New IV placed. Labs sent. RN at bedside with pt continuing to monitor.
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
## 2020-04-01 NOTE — Progress Notes (Signed)
[place] at bedside. Dr. Valentino Saxon at bedside evaluating pt

## 2020-04-02 ENCOUNTER — Encounter: Payer: Self-pay | Admitting: Obstetrics and Gynecology

## 2020-04-02 DIAGNOSIS — F319 Bipolar disorder, unspecified: Secondary | ICD-10-CM

## 2020-04-02 DIAGNOSIS — Z3400 Encounter for supervision of normal first pregnancy, unspecified trimester: Secondary | ICD-10-CM

## 2020-04-02 DIAGNOSIS — O42013 Preterm premature rupture of membranes, onset of labor within 24 hours of rupture, third trimester: Secondary | ICD-10-CM

## 2020-04-02 DIAGNOSIS — Z62810 Personal history of physical and sexual abuse in childhood: Secondary | ICD-10-CM

## 2020-04-02 DIAGNOSIS — Z8659 Personal history of other mental and behavioral disorders: Secondary | ICD-10-CM

## 2020-04-02 LAB — SYPHILIS: RPR W/REFLEX TO RPR TITER AND TREPONEMAL ANTIBODIES, TRADITIONAL SCREENING AND DIAGNOSIS ALGORITHM: RPR Ser Ql: NONREACTIVE

## 2020-04-02 LAB — CBC
HCT: 31.4 % — ABNORMAL LOW (ref 36.0–46.0)
Hemoglobin: 11.1 g/dL — ABNORMAL LOW (ref 12.0–15.0)
MCH: 32.4 pg (ref 26.0–34.0)
MCHC: 35.4 g/dL (ref 30.0–36.0)
MCV: 91.5 fL (ref 80.0–100.0)
Platelets: 230 10*3/uL (ref 150–400)
RBC: 3.43 MIL/uL — ABNORMAL LOW (ref 3.87–5.11)
RDW: 12.4 % (ref 11.5–15.5)
WBC: 19.8 10*3/uL — ABNORMAL HIGH (ref 4.0–10.5)
nRBC: 0 % (ref 0.0–0.2)

## 2020-04-02 LAB — FETAL SCREEN: Fetal Screen: NEGATIVE

## 2020-04-02 MED ORDER — SENNOSIDES-DOCUSATE SODIUM 8.6-50 MG PO TABS
2.0000 | ORAL_TABLET | ORAL | Status: DC
  Administered 2020-04-04: 2 via ORAL
  Filled 2020-04-02 (×2): qty 2

## 2020-04-02 MED ORDER — DIPHENHYDRAMINE HCL 25 MG PO CAPS: 25.0000 mg | ORAL_CAPSULE | Freq: Four times a day (QID) | ORAL | Status: DC | PRN

## 2020-04-02 MED ORDER — TETANUS-DIPHTH-ACELL PERTUSSIS 5-2.5-18.5 LF-MCG/0.5 IM SUSP
0.5000 mL | Freq: Once | INTRAMUSCULAR | Status: DC
  Filled 2020-04-02: qty 0.5

## 2020-04-02 MED ORDER — SIMETHICONE 80 MG PO CHEW: 80.0000 mg | CHEWABLE_TABLET | ORAL | Status: DC | PRN

## 2020-04-02 MED ORDER — IBUPROFEN 800 MG PO TABS
800.0000 mg | ORAL_TABLET | Freq: Four times a day (QID) | ORAL | Status: DC
  Administered 2020-04-02 – 2020-04-04 (×8): 800 mg via ORAL
  Filled 2020-04-02 (×8): qty 1

## 2020-04-02 MED ORDER — ONDANSETRON HCL 4 MG/2ML IJ SOLN: 4.0000 mg | INTRAMUSCULAR | Status: DC | PRN

## 2020-04-02 MED ORDER — ZOLPIDEM TARTRATE 5 MG PO TABS: 5.0000 mg | ORAL_TABLET | Freq: Every evening | ORAL | Status: DC | PRN

## 2020-04-02 MED ORDER — WITCH HAZEL-GLYCERIN EX PADS: 1.0000 "application " | MEDICATED_PAD | CUTANEOUS | Status: DC | PRN

## 2020-04-02 MED ORDER — PRENATAL MULTIVITAMIN CH
1.0000 | ORAL_TABLET | Freq: Every day | ORAL | Status: DC
  Filled 2020-04-02 (×2): qty 1

## 2020-04-02 MED ORDER — OXYTOCIN 40 UNITS IN NORMAL SALINE INFUSION - SIMPLE MED
INTRAVENOUS | Status: AC
  Filled 2020-04-02: qty 1000

## 2020-04-02 MED ORDER — ACETAMINOPHEN 325 MG PO TABS
650.0000 mg | ORAL_TABLET | ORAL | Status: DC | PRN
  Administered 2020-04-02 – 2020-04-04 (×4): 650 mg via ORAL
  Filled 2020-04-02 (×4): qty 2

## 2020-04-02 MED ORDER — DIBUCAINE (PERIANAL) 1 % EX OINT: 1.0000 "application " | TOPICAL_OINTMENT | CUTANEOUS | Status: DC | PRN

## 2020-04-02 MED ORDER — ONDANSETRON HCL 4 MG PO TABS: 4.0000 mg | ORAL_TABLET | ORAL | Status: DC | PRN

## 2020-04-02 MED ORDER — RHO D IMMUNE GLOBULIN 1500 UNIT/2ML IJ SOSY
300.0000 ug | PREFILLED_SYRINGE | Freq: Once | INTRAMUSCULAR | Status: AC
  Administered 2020-04-02: 300 ug via INTRAVENOUS
  Filled 2020-04-02: qty 2

## 2020-04-02 MED ORDER — COCONUT OIL OIL: 1.0000 "application " | TOPICAL_OIL | Status: DC | PRN

## 2020-04-02 MED ORDER — BENZOCAINE-MENTHOL 20-0.5 % EX AERO
1.0000 "application " | INHALATION_SPRAY | CUTANEOUS | Status: DC | PRN
  Filled 2020-04-02: qty 56
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
## 2020-04-02 NOTE — Progress Notes (Addendum)
CSW received a call from CPS Worker, Gerhard Perches (Office #: [phone], Cell #: [phone]). Provided information from chart and interactions with [name] as well as updates from RN. Debbe Odea reported she plans to meet with [name] as early as she can tomorrow morning. She asked that [name] not be discharged until she comes to meet with [name]. Informed Debbe Odea that we cannot legally hold patient unless it is court ordered, but that I will let RN and MD know of request. She verbalized understanding and said she will call in the morning with a time for her visit.   Alfonso Ramus, [place] [phone]

## 2020-04-02 NOTE — Progress Notes (Signed)
Notified SW of consult

## 2020-04-03 DIAGNOSIS — Z3A49 Greater than 42 weeks gestation of pregnancy: Secondary | ICD-10-CM

## 2020-04-03 LAB — MEASLES/MUMPS/RUBELLA IMMUNITY
Mumps IgG: 34.3 AU/mL (ref >10.9)
Rubella: 3.86 index (ref >0.99)
Rubeola IgG: 99.7 AU/mL (ref >16.4)

## 2020-04-03 LAB — RHOGAM INJECTION: Unit division: 0
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
## 2020-04-03 NOTE — Progress Notes (Addendum)
CSW informed [name] does not have any clothes. Provided clothing from donation closet.  [name] more alert and happy today. Jennifer (sister in law) at bedside and reports [name] will be moving in with her at discharge.  CSW also attempted call to CPS Worker Latisha Logan about when she will be visiting as [name] and Jennifer have been asking and CSW has not heard from Latisha this morning. Left a voicemail for Latisha requesting a return call.  Lynzy Rawles, LCSW [phone]  

## 2020-04-03 NOTE — Progress Notes (Signed)
CPS here to see pt

## 2020-04-03 NOTE — Progress Notes (Signed)
   04/03/20 1520  Clinical Encounter Type  Visited With Patient and family together  Visit Type Initial  Referral From Nurse  Consult/Referral To Chaplain  While rounding Chaplain visited with patient and sister-in-law in the nursery. Patient looked half asleep and told Chaplain that she only sees when in the nursery. Chaplain wished them well and left.

## 2020-04-04 DIAGNOSIS — Z3A49 Greater than 42 weeks gestation of pregnancy: Secondary | ICD-10-CM

## 2020-04-04 LAB — SURGICAL PATHOLOGY

## 2020-04-04 MED ORDER — IBUPROFEN 800 MG PO TABS: 800.0000 mg | ORAL_TABLET | Freq: Three times a day (TID) | ORAL | 0 refills | Status: DC | PRN

## 2020-04-04 MED ORDER — MEDROXYPROGESTERONE ACETATE 150 MG/ML IM SUSP
150.0000 mg | Freq: Once | INTRAMUSCULAR | Status: AC
  Administered 2020-04-04: 150 mg via INTRAMUSCULAR
  Filled 2020-04-04: qty 1

## 2020-04-04 MED ORDER — QUETIAPINE FUMARATE 300 MG PO TABS: 300.0000 mg | ORAL_TABLET | Freq: Every day | ORAL | 2 refills | Status: DC
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
## 2020-04-04 NOTE — Progress Notes (Signed)
DC to home; ambulatory to car with sister in law.

## 2020-04-04 NOTE — Progress Notes (Signed)
DC inst reviewed with pt.  Linda Chapman her sister in law whom she plans to live with also present for teaching.  Both verb u/o and know of her meds and f/u mood check in 2 wks.
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
## 2020-05-10 ENCOUNTER — Telehealth: Payer: Self-pay

## 2020-05-10 NOTE — Telephone Encounter (Signed)
Attempted call to schedule PP appt; no answer, left voicemail message with appt # to call Sharlette Dense, RN

## 2020-05-24 ENCOUNTER — Other Ambulatory Visit: Payer: Self-pay

## 2020-05-24 ENCOUNTER — Encounter: Payer: Self-pay | Admitting: Family Medicine

## 2020-05-24 ENCOUNTER — Ambulatory Visit: Payer: Medicaid Other | Admitting: Family Medicine

## 2020-05-24 DIAGNOSIS — Z30013 Encounter for initial prescription of injectable contraceptive: Secondary | ICD-10-CM

## 2020-05-24 DIAGNOSIS — Z3009 Encounter for other general counseling and advice on contraception: Secondary | ICD-10-CM | POA: Diagnosis not present

## 2020-05-24 DIAGNOSIS — Z113 Encounter for screening for infections with a predominantly sexual mode of transmission: Secondary | ICD-10-CM

## 2020-05-24 LAB — HEMOGLOBIN, FINGERSTICK: Hemoglobin: 12.7 g/dL (ref 11.1–15.9)

## 2020-05-24 LAB — WET PREP FOR TRICH, YEAST, CLUE
Trichomonas Exam: NEGATIVE
Yeast Exam: NEGATIVE

## 2020-05-24 MED ORDER — MEDROXYPROGESTERONE ACETATE 150 MG/ML IM SUSP: 150.0000 mg | INTRAMUSCULAR | Status: AC
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
## 2020-06-03 ENCOUNTER — Encounter: Payer: Self-pay | Admitting: Family Medicine

## 2020-06-03 LAB — HM HEPATITIS C SCREENING LAB: HM Hepatitis Screen: NEGATIVE

## 2020-06-03 LAB — HM HIV SCREENING LAB: HM HIV Screening: NEGATIVE

## 2020-06-28 ENCOUNTER — Other Ambulatory Visit: Payer: Self-pay

## 2020-06-28 ENCOUNTER — Emergency Department
Admission: EM | Admit: 2020-06-28 | Discharge: 2020-06-29 | Disposition: A | Discharge status: Other Acute Inpatient Hospital | Payer: Medicaid Other | Attending: Emergency Medicine | Admitting: Emergency Medicine

## 2020-06-28 DIAGNOSIS — F329 Major depressive disorder, single episode, unspecified: Secondary | ICD-10-CM | POA: Diagnosis not present

## 2020-06-28 DIAGNOSIS — Z20822 Contact with and (suspected) exposure to covid-19: Secondary | ICD-10-CM | POA: Diagnosis not present

## 2020-06-28 DIAGNOSIS — J45909 Unspecified asthma, uncomplicated: Secondary | ICD-10-CM | POA: Insufficient documentation

## 2020-06-28 DIAGNOSIS — F17210 Nicotine dependence, cigarettes, uncomplicated: Secondary | ICD-10-CM | POA: Diagnosis not present

## 2020-06-28 DIAGNOSIS — Z1339 Encounter for screening examination for other mental health and behavioral disorders: Secondary | ICD-10-CM | POA: Diagnosis present

## 2020-06-28 DIAGNOSIS — R45851 Suicidal ideations: Secondary | ICD-10-CM | POA: Insufficient documentation

## 2020-06-28 LAB — COMPREHENSIVE METABOLIC PANEL
ALT: 84 U/L — ABNORMAL HIGH (ref 0–44)
AST: 32 U/L (ref 15–41)
Albumin: 4.6 g/dL (ref 3.5–5.0)
Alkaline Phosphatase: 78 U/L (ref 38–126)
Anion gap: 14 (ref 5–15)
BUN: 16 mg/dL (ref 6–20)
CO2: 22 mmol/L (ref 22–32)
Calcium: 9.2 mg/dL (ref 8.9–10.3)
Chloride: 104 mmol/L (ref 98–111)
Creatinine, Ser: 0.77 mg/dL (ref 0.44–1.00)
GFR calc Af Amer: >60 mL/min (ref >60)
GFR calc non Af Amer: >60 mL/min (ref >60)
Glucose, Bld: 102 mg/dL — ABNORMAL HIGH (ref 70–99)
Potassium: 3.4 mmol/L — ABNORMAL LOW (ref 3.5–5.1)
Sodium: 140 mmol/L (ref 135–145)
Total Bilirubin: 0.9 mg/dL (ref 0.3–1.2)
Total Protein: 7.8 g/dL (ref 6.5–8.1)

## 2020-06-28 LAB — CBC
HCT: 38.4 % (ref 36.0–46.0)
Hemoglobin: 13.1 g/dL (ref 12.0–15.0)
MCH: 29.1 pg (ref 26.0–34.0)
MCHC: 34.1 g/dL (ref 30.0–36.0)
MCV: 85.3 fL (ref 80.0–100.0)
Platelets: 437 10*3/uL — ABNORMAL HIGH (ref 150–400)
RBC: 4.5 MIL/uL (ref 3.87–5.11)
RDW: 13.3 % (ref 11.5–15.5)
WBC: 9.2 10*3/uL (ref 4.0–10.5)
nRBC: 0 % (ref 0.0–0.2)

## 2020-06-28 LAB — SALICYLATE LEVEL: Salicylate Lvl: <7 mg/dL — ABNORMAL LOW (ref 7.0–30.0)

## 2020-06-28 LAB — ACETAMINOPHEN LEVEL: Acetaminophen (Tylenol), Serum: <10 ug/mL — ABNORMAL LOW (ref 10–30)

## 2020-06-28 LAB — ETHANOL: Alcohol, Ethyl (B): <10 mg/dL (ref <10)

## 2020-06-28 NOTE — ED Notes (Signed)
Hourly rounding reveals patient sleeping in hall bed. No complaints, stable, in no acute distress. Q15 minute rounds and monitoring via Rover and Officer to continue.  

## 2020-06-28 NOTE — ED Notes (Signed)
Hourly rounding reveals patient awake in hall bed. No complaints, stable, in no acute distress. Q15 minute rounds and monitoring via Rover and Officer to continue.  

## 2020-06-28 NOTE — ED Notes (Addendum)
Patient changed into hospital provided scrubs by this RN and Caitlyn NT. Patient's belonging's placed into belonging's bag. Patient's belonging's include:  Black shoes, white socks, black purse, grey sweat pants, panties, brown shirt, green bra, smart watch, metal necklace with charm, 2 pairs earrings, single earring, nose ring, eyebrow ring, lip ring, $65 cash, black lighter, hair tie.

## 2020-06-28 NOTE — ED Notes (Signed)
Pt. Transferred from Triage to room 20 hall after dressing out and screening for contraband. Pt. Oriented to Quad including Q15 minute rounds as well as Rover and Officer for their protection. Patient is alert and oriented, warm and dry in no acute distress. Patient denies SI, HI, and AVH. Pt. Encouraged to let me know if needs arise.  
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
## 2020-06-28 NOTE — ED Notes (Signed)
Hourly rounding reveals patient asleep in hall bed. No complaints, stable, in no acute distress. Q15 minute rounds and monitoring via Rover and Officer to continue.  
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
## 2020-06-28 NOTE — ED Triage Notes (Signed)
Patient reports she was IVC'd due to her significant other reporting that she threatened SI. Patient denies. Patient hostile. Patient unwilling to let RN draw blood or change out at this time.

## 2020-06-29 ENCOUNTER — Encounter: Payer: Self-pay | Admitting: Psychiatry

## 2020-06-29 ENCOUNTER — Inpatient Hospital Stay
Admission: RE | Admit: 2020-06-29 | Discharge: 2020-06-30 | DRG: 885 | Disposition: A | Discharge status: Home / Self Care | Payer: Medicaid Other | Source: Intra-hospital | Attending: Psychiatry | Admitting: Psychiatry

## 2020-06-29 ENCOUNTER — Other Ambulatory Visit: Payer: Self-pay

## 2020-06-29 DIAGNOSIS — R45851 Suicidal ideations: Secondary | ICD-10-CM | POA: Diagnosis present

## 2020-06-29 DIAGNOSIS — F17210 Nicotine dependence, cigarettes, uncomplicated: Secondary | ICD-10-CM | POA: Diagnosis present

## 2020-06-29 DIAGNOSIS — F1410 Cocaine abuse, uncomplicated: Secondary | ICD-10-CM | POA: Diagnosis present

## 2020-06-29 DIAGNOSIS — Z915 Personal history of self-harm: Secondary | ICD-10-CM

## 2020-06-29 DIAGNOSIS — T402X2A Poisoning by other opioids, intentional self-harm, initial encounter: Secondary | ICD-10-CM | POA: Diagnosis present

## 2020-06-29 DIAGNOSIS — F3162 Bipolar disorder, current episode mixed, moderate: Principal | ICD-10-CM | POA: Diagnosis present

## 2020-06-29 DIAGNOSIS — F329 Major depressive disorder, single episode, unspecified: Secondary | ICD-10-CM | POA: Diagnosis not present

## 2020-06-29 LAB — SARS CORONAVIRUS 2 BY RT PCR (HOSPITAL ORDER, PERFORMED IN ~~LOC~~ HOSPITAL LAB): SARS Coronavirus 2: NEGATIVE

## 2020-06-29 MED ORDER — MAGNESIUM HYDROXIDE 400 MG/5ML PO SUSP: 30.0000 mL | Freq: Every day | ORAL | Status: DC | PRN

## 2020-06-29 MED ORDER — QUETIAPINE FUMARATE 25 MG PO TABS: 12.5000 mg | ORAL_TABLET | Freq: Every day | ORAL | Status: DC

## 2020-06-29 MED ORDER — ACETAMINOPHEN 325 MG PO TABS: 650.0000 mg | ORAL_TABLET | Freq: Four times a day (QID) | ORAL | Status: DC | PRN

## 2020-06-29 MED ORDER — LAMOTRIGINE 25 MG PO TABS
25.0000 mg | ORAL_TABLET | Freq: Two times a day (BID) | ORAL | Status: DC
  Administered 2020-06-29: 25 mg via ORAL
  Filled 2020-06-29: qty 1

## 2020-06-29 MED ORDER — IBUPROFEN 600 MG PO TABS: 800.0000 mg | ORAL_TABLET | Freq: Three times a day (TID) | ORAL | Status: DC | PRN

## 2020-06-29 MED ORDER — HYDROXYZINE HCL 50 MG PO TABS: 50.0000 mg | ORAL_TABLET | Freq: Three times a day (TID) | ORAL | Status: DC | PRN

## 2020-06-29 MED ORDER — ALUM & MAG HYDROXIDE-SIMETH 200-200-20 MG/5ML PO SUSP: 30.0000 mL | ORAL | Status: DC | PRN

## 2020-06-29 MED ORDER — ADULT MULTIVITAMIN W/MINERALS CH
1.0000 | ORAL_TABLET | Freq: Every day | ORAL | Status: DC
  Administered 2020-06-29 – 2020-06-30 (×2): 1 via ORAL
  Filled 2020-06-29 (×2): qty 1

## 2020-06-29 MED ORDER — QUETIAPINE FUMARATE 100 MG PO TABS
300.0000 mg | ORAL_TABLET | Freq: Every day | ORAL | Status: DC
  Administered 2020-06-29: 300 mg via ORAL
  Filled 2020-06-29: qty 3

## 2020-06-29 MED ORDER — DULOXETINE HCL 20 MG PO CPEP
20.0000 mg | ORAL_CAPSULE | Freq: Every day | ORAL | Status: DC
  Administered 2020-06-29: 20 mg via ORAL
  Filled 2020-06-29: qty 1

## 2020-06-29 NOTE — Consult Note (Signed)
[facility] Consult   Reason for Consult:    On IVC --worsening OOC behaviors, unclear safety margin " had wanted to harm self"---cocaine dependence post intoxication, bipolar traits that are OOC / poor med compliance  Post partum 3 months, with cocaine positive child with relatives,  Running away --placing self in dangerous situations, unclear safety margin    Referring Physician:  MD ---- Patient Identification: Linda Chapman MRN:  [id number] Principal Psychiatric Diagnosis: <principal problem not specified>   Bipolar disorder  Cocaine dependence, intoxication withdrawal Borderline personality  Three months post partum with cocaine positive child   Medical Diagnosis:  Active Problems:   * No active hospital problems. * Post effects of intoxication  Total Time spent with patient:   45 min    SUBJECTIVE  Chief Complaint:  Limited she is too sleepy in general   Linda Chapman is a 19 y.o. female patient admitted with worsening safety margin   Blood pressure (!) 110/91, pulse (!) 111, temperature 98.5 F (36.9 C), temperature source Oral, resp. rate 20, height '5\' 3"'  (1.6 m), weight 52.6 kg, last menstrual period 06/28/2020, SpO2 98 %, not currently breastfeeding.Body mass index is 20.55 kg/m.  HPI:    Chaotic several days with relapse of cocaine --BF alleges on IVC that she has been visiting cocaine house and becoming intoxicated.  She has been threatening to harm self and her depression and anxiety are worsening ---  She has mood swings, ups and downs, lability, lack of sleep, highs and lows, speeded actions and thoughts ---impulsivity--poor choices ----mixed with major depression including SI with possible plans   She has depressed mood, crying spells hopeless helpless feelings --lack of enthusiasm, frustration SI with possible plans -----lack of motivation, concentration----  Unclear if she has psychotic symptoms at  this time   She has excessive worry,  Nervousness tension frustration ---panic dread doom and fear   She has persistent identify disturbance, impulsivity --chronic chaos emptiness and boredom ----and affective instability   Cocaine dependence is drug of choice and causes clouded consciousness and judgement problems   Now admitted for stabilization and issues     Prior Inpatient Therapy: Prior Inpatient Therapy: Yes Prior Therapy Dates: 07/2019 Prior Therapy Facilty/Provider(s): [facility] Reason for Treatment: Suicide attempt  Prior Outpatient Therapy Visits: Prior Outpatient Therapy: Yes Prior Therapy Dates: Current Prior Therapy Facilty/Provider(s): [facility] Reason for Treatment: Addiction, Depression Does patient have an ACCT team?: No Does patient have Intensive In-House Services?  : No Does patient have [facility] services? : No Does patient have P4CC services?: No  Previous Psychiatric Medications:   Current Facility-Administered Medications  Medication Dose Route Frequency Provider Last Rate Last Admin  . [START ON 07/01/2020] medroxyPROGESTERone (DEPO-PROVERA) injection 150 mg  150 mg Intramuscular Q90 days Caren Macadam, MD       Current Outpatient Medications  Medication Sig Dispense Refill  . QUEtiapine (SEROQUEL) 300 MG tablet Take 1 tablet (300 mg total) by mouth at bedtime. 30 tablet 2  . ibuprofen (ADVIL) 800 MG tablet Take 1 tablet (800 mg total) by mouth every 8 (eight) hours as needed. (Patient not taking: Reported on 06/28/2020) 30 tablet 0  . Multiple Vitamins-Minerals (MULTIVITAMIN WITH MINERALS) tablet Take 1 tablet by mouth daily. (Patient not taking: Reported on 06/28/2020)    . NARCAN 4 MG/0.1ML LIQD nasal spray kit Place 4 mg into the nose once.    . Prenatal Vit-Fe Fumarate-FA (PRENATAL VITAMIN) 27-0.8 MG TABS Take 1 tablet  by mouth daily at 6 (six) AM. (Patient not taking: Reported on 04/01/2020) 100 tablet 0    Past Psychiatric History:   Past  Medical History:  Past Medical History:  Diagnosis Date  . Anxiety    dg @ 13  . Asthma    Seasonal allergies --no inhaler since child  . Bipolar 1 disorder ([place])   . Depression    Dg in pas--currently sees therapist @ [facility] in [place]; stopped Quetipine  . History of chest pain   . History of dizziness   . History of headache   . Seasonal allergies   . Vision problem    floaters in left eye    Past Surgical History:  Procedure Laterality Date  . Denies surgical hx      No Known Allergies  Family Medical and Psychiatric History:   Family History  Problem Relation Age of Onset  . Asthma Mother   . Depression Mother   . Anxiety disorder Mother   . Bipolar disorder Paternal Aunt     Social History:   Social History   Socioeconomic History  . Marital status: Significant Other    Spouse name: Not on file  . Number of children: Not on file  . Years of education: 39  . Highest education level: Not on file  Occupational History  . Occupation: Data processing manager: [employer]  Tobacco Use  . Smoking status: Current Every Day Smoker    Packs/day: 0.50    Years: 5.00    Pack years: 2.50    Types: Cigarettes  . Smokeless tobacco: Never Used  . Tobacco comment: Cut down since pregnant  Vaping Use  . Vaping Use: Some days  . Last attempt to quit: 07/17/2019  . Substances: Nicotine, Flavoring  Substance and Sexual Activity  . Alcohol use: Not Currently    Comment: No use since delivery 04/02/2020  . Drug use: Not Currently    Frequency: 2.0 times per week    Types: Marijuana, Cocaine    Comment: No use since delivery 04/02/2020  . Sexual activity: Yes    Birth control/protection: Injection, Condom    Comment: Nexplanon removed several months ago (2019).  Other Topics Concern  . Not on file  Social History Narrative  . Not on file   Social Determinants of Health   Financial Resource Strain: Medium Risk  . Difficulty of Paying Living  Expenses: Somewhat hard  Food Insecurity: Food Insecurity Present  . Worried About Charity fundraiser in the Last Year: Sometimes true  . Ran Out of Food in the Last Year: Sometimes true  Transportation Needs: No Transportation Needs  . Lack of Transportation (Medical): No  . Lack of Transportation (Non-Medical): No  Physical Activity:   . Days of Exercise per Week:   . Minutes of Exercise per Session:   Stress: Stress Concern Present  . Feeling of Stress : To some extent  Social Connections:   . Frequency of Communication with Friends and Family:   . Frequency of Social Gatherings with Friends and Family:   . Attends Religious Services:   . Active Member of Clubs or Organizations:   . Attends Archivist Meetings:   Marland Kitchen Marital Status:       Educational History:   Part HS   Social history --not working, three month old --living with relatives, not clear if she has folowed up with post partum care   Continues  with substance use ---has not been motivated to do recovery per se  Developmental History:  None reported  Court and legal issues  Not known   Substance/Drug History:   Social History   Substance and Sexual Activity  Alcohol Use Not Currently   Comment: No use since delivery 04/02/2020    Social History   Substance and Sexual Activity  Drug Use Not Currently  . Frequency: 2.0 times per week  . Types: Marijuana, Cocaine   Comment: No use since delivery 04/02/2020    LABS  Results for orders placed or performed during the hospital encounter of 06/28/20 (from the past 48 hour(s))  Comprehensive metabolic panel     Status: Abnormal   Collection Time: 06/28/20  8:01 PM  Result Value Ref Range   Sodium 140 135 - 145 mmol/L   Potassium 3.4 (L) 3.5 - 5.1 mmol/L   Chloride 104 98 - 111 mmol/L   CO2 22 22 - 32 mmol/L   Glucose, Bld 102 (H) 70 - 99 mg/dL    Comment: Glucose reference range applies only to samples taken after fasting for at least 8 hours.    BUN 16 6 - 20 mg/dL   Creatinine, Ser 0.77 0.44 - 1.00 mg/dL   Calcium 9.2 8.9 - 10.3 mg/dL   Total Protein 7.8 6.5 - 8.1 g/dL   Albumin 4.6 3.5 - 5.0 g/dL   AST 32 15 - 41 U/L   ALT 84 (H) 0 - 44 U/L   Alkaline Phosphatase 78 38 - 126 U/L   Total Bilirubin 0.9 0.3 - 1.2 mg/dL   GFR calc non Af Amer >60 >60 mL/min   GFR calc Af Amer >60 >60 mL/min   Anion gap 14 5 - 15    Comment: Performed at [facility], [address] [place], [place] [address]  Ethanol     Status: None   Collection Time: 06/28/20  8:01 PM  Result Value Ref Range   Alcohol, Ethyl (B) <10 <10 mg/dL    Comment: (NOTE) Lowest detectable limit for serum alcohol is 10 mg/dL.  For medical purposes only. Performed at [facility], [address] [place], [place] [address]   Salicylate level     Status: Abnormal   Collection Time: 06/28/20  8:01 PM  Result Value Ref Range   Salicylate Lvl <2.9 (L) 7.0 - 30.0 mg/dL    Comment: Performed at [facility], [address] [place], [place] [address]  Acetaminophen level     Status: Abnormal   Collection Time: 06/28/20  8:01 PM  Result Value Ref Range   Acetaminophen (Tylenol), Serum <10 (L) 10 - 30 ug/mL    Comment: (NOTE) Therapeutic concentrations vary significantly. A range of 10-30 ug/mL  may be an effective concentration for many patients. However, some  are best treated at concentrations outside of this range. Acetaminophen concentrations >150 ug/mL at 4 hours after ingestion  and >50 ug/mL at 12 hours after ingestion are often associated with  toxic reactions.  Performed at [facility], [address] [place], [place] [address]   cbc     Status: Abnormal   Collection Time: 06/28/20  8:01 PM  Result Value Ref Range   WBC 9.2 4.0 - 10.5 K/uL   RBC 4.50 3.87 - 5.11 MIL/uL   Hemoglobin 13.1 12.0 - 15.0 g/dL   HCT 38.4 36 - 46 %   MCV 85.3 80.0 - 100.0 fL   MCH 29.1 26.0 -  34.0 pg   MCHC 34.1 30.0 - 36.0 g/dL    RDW 13.3 11.5 - 15.5 %   Platelets 437 (H) 150 - 400 K/uL   nRBC 0.0 0.0 - 0.2 %    Comment: Performed at [facility], [address] [place], [place] [address]    PHYSICAL EXAM  Physical Exam  SYSTEMS REVIEW  Review of Systems---8-10 looked   MENTAL STATUS  General Appearance: thin unkept sickly --messy forlorn ---mostly sleeping   Rapport/Eye Contact:  Poor   Orientation:  Times four okay   Consciousness:  Clouded when intoxicated   Concentration:    Poor --  Mood/Affect:  --depressed and anxious   Language/Speech:  No new change   Rate:  Volume:  Fluency:  Articulation:  Thought Process:  Illogical disorganized vague  Thought Content:  Depressive themes addictive personality themes  SUICIDAL/HOMICIDAL ity   Risk to Self: Suicidal Ideation: No Suicidal Intent: No Is patient at risk for suicide?: No Suicidal Plan?: No Access to Means: No What has been your use of drugs/alcohol within the last 12 months?: Cocaine abuse How many times?: 1 Other Self Harm Risks: None Triggers for Past Attempts: None known Intentional Self Injurious Behavior: None Risk to Others: Homicidal Ideation: No Thoughts of Harm to Others: No Current Homicidal Intent: No Current Homicidal Plan: No Access to Homicidal Means: No Identified Victim: None History of harm to others?: No Assessment of Violence: None Noted Violent Behavior Description: None Does patient have access to weapons?: No Criminal Charges Pending?: No Does patient have a court date: No  Memory/Recall:    Immediate:  Recent:  Remote:  Fund of Knowledge/Intelligence/Cognition:    Judgement:   Insight:   Reliability:    MOVEMENTS  Psychomotor Activity:  Normal to slow  Handedness:  Not known   Strength & Muscle Tone:  Normal  Gait & Station:  Patient leans:   Akathisia:  None  AIMS (if indicated):     Assets:  Not clear  Liabilities:  Addictive personality   ADL'S:  Poor    Sleep:  Poor   Appetite:  Fair   Formulation:    Treatment Plan Summary:  IVC admission---to Psych for stabliization and for help with mgt and support for better outpatient plan.  Ideally ---needs long term dual diagnosis rehab and support    Admitted to [facility] psychiat[facility]   Disposition: Eulas Post, MD 06/29/2020 3:11 PM

## 2020-06-29 NOTE — BH Assessment (Signed)
Patient is to be admitted to [facility] by Dr.Rao.  Attending Physician will be Dr. Toni Amend.   Patient has been assigned to room 303, by [facility] Charge Nurse Megan.   Intake Paper Work has been signed and placed on patient chart.  ER staff is aware of the admission:  Ronnie, ER Secretary    Dr. Darnelle Catalan, ER MD   Para March Patient's Nurse   Alyssa Patient Access.

## 2020-06-29 NOTE — ED Notes (Signed)
Hourly rounding reveals patient sleeping in hall bed. No complaints, stable, in no acute distress. Q15 minute rounds and monitoring via Rover and Officer to continue.  

## 2020-06-29 NOTE — ED Provider Notes (Signed)
Emergency Medicine Observation Re-evaluation Note  Linda Chapman is a 19 y.o. female, seen on rounds today.  Pt initially presented to the ED for complaints of Suicidal   Physical Exam  BP (!) 110/91 (BP Location: Left Arm)   Pulse (!) 111   Temp 98.5 F (36.9 C) (Oral)   Resp 20   Ht 5\' 3"  (1.6 m)   Wt 52.6 kg   LMP 06/28/2020   SpO2 98%   BMI 20.55 kg/m  Physical Exam  Patient sleeping comfortably in bed. Constitutional resting well in no distress Respiratory: Breathing normally no retractions no respiratory distress ED Course / MDM  EKG:    I have reviewed the labs performed to date as well as medications administered while in observation.  Plan  Disposition per psychiatry.  Patient slightly tachycardic last night.  We will need to keep following this.   08/29/2020, MD 06/29/20 [phone]

## 2020-06-29 NOTE — ED Notes (Signed)
Patient unable to go to BMU due to not being covid tested. Order obtained. Patient swabbed for covid. Awaiting results for transfer to BMU unit.

## 2020-06-29 NOTE — Tx Team (Signed)
Initial Treatment Plan 06/29/2020 6:20 PM [name] [id number]    PATIENT STRESSORS: Health problems Marital or family conflict Medication change or noncompliance Substance abuse   PATIENT STRENGTHS: Ability for insight Active sense of humor Average or above average intelligence   PATIENT IDENTIFIED PROBLEMS: Unstable mood  Ineffective coping skills                   DISCHARGE CRITERIA:  Ability to meet basic life and health needs Adequate post-discharge living arrangements  PRELIMINARY DISCHARGE PLAN: Outpatient therapy Return to previous living arrangement Return to previous work or school arrangements  PATIENT/FAMILY INVOLVEMENT: This treatment plan has been presented to and reviewed with the patient, Linda Chapman.  The patient and family have been given the opportunity to ask questions and make suggestions.  Berkley Harvey, RN 06/29/2020, 6:20 PM

## 2020-06-29 NOTE — ED Notes (Signed)
Assumed care of patient. Patient sleeping comfortably, as per shift nurse patient slept through the night w/o issues or concerns. Will continue to monitor. Safety maintained.

## 2020-06-29 NOTE — Consult Note (Signed)
[facility] Consult   Reason for Consult:  Psych evaluation  Referring Physician:  Dr. Charna Archer Patient Identification: Linda Chapman MRN:  [id number] Principal Diagnosis: <principal problem not specified> Diagnosis:  Active Problems:   * No active hospital problems. *   Total Time spent with patient: 45 minutes  Subjective:  "My boyfrend lied, I dont know why im here".  HPI:  Per WJX:[id number] Linda Chapman is an 18 y.o. female presenting to [facility] ED under IVC. Per triage note Patient reports she was IVC'd due to her significant other reporting that she threatened SI. Patient denies. Patient hostile. During assessment patient was alert and oriented, calm and cooperative, but somewhat irritable. Patient reported why she was presenting to ED "my baby daddy told officers that I was trying to kill myself, I never said that I was going to, he's the one that said he was cutting himself, I just told him I was going ghost." Patient reports that she recently had a 42 month old baby who lives with her sister and reports that she is currently in [place] with DSS to determine if she is a fit mother. Patient reports that her child does not live with her due to the baby having cocaine in it's system. Patient reports a history of Cocaine use but denies using anything within the past 3 moths since the baby was born. Patient also reports current outpatient treatment at "[facility]" and reports taking her medication as prescribed. Patient reports 1 past hospitalization last August due to a suicide attempt "I swallowed a crap ton of pill" and reports being treated at [facility]. Patient continues to deny SI/HI/AH/VH and does not appear to be responding to any internal or external stimuli. No current UDS available at this time.   Collateral information was obtained from patient's boyfriend Linda Chapman [phone] who reports "we had been looking for her for a couple of days, we  realized she relapsed, but she told us she was kidnapped, then she goes on facebook and writes that everyone is against her and she wanted to go ghost." "One day she texted me that she wanted to kill herself, then started going off that I hate her and her family hates her, I told her that that was her decision but she needs to come home and talk to me about it." "This is what she does when she gets high she will go on to say she wants to kill herself." Patient's boyfriend reports "she normally lives with me but recently she has been at the dope house because that's where she goes to use."   UDS pendind. Pt denying SI, and HI and has requested to leave.   Past Psychiatric History:   Risk to Self: Suicidal Ideation: No Suicidal Intent: No Is patient at risk for suicide?: No Suicidal Plan?: No Access to Means: No What has been your use of drugs/alcohol within the last 12 months?: Cocaine abuse How many times?: 1 Other Self Harm Risks: None Triggers for Past Attempts: None known Intentional Self Injurious Behavior: None Risk to Others: Homicidal Ideation: No Thoughts of Harm to Others: No Current Homicidal Intent: No Current Homicidal Plan: No Access to Homicidal Means: No Identified Victim: None History of harm to others?: No Assessment of Violence: None Noted Violent Behavior Description: None Does patient have access to weapons?: No Criminal Charges Pending?: No Does patient have a court date: No Prior Inpatient Therapy: Prior Inpatient Therapy: Yes Prior Therapy Dates: 07/2019 Prior Therapy  Facilty/Provider(s): [facility] Reason for Treatment: Suicide attempt Prior Outpatient Therapy: Prior Outpatient Therapy: Yes Prior Therapy Dates: Current Prior Therapy Facilty/Provider(s): [facility] Reason for Treatment: Addiction, Depression Does patient have an ACCT team?: No Does patient have Intensive In-House Services?  : No Does patient have [facility] services? : No Does patient have P4CC  services?: No  Past Medical History:  Past Medical History:  Diagnosis Date  . Anxiety    dg @ 13  . Asthma    Seasonal allergies --no inhaler since child  . Bipolar 1 disorder ([place])   . Depression    Dg in pas--currently sees therapist @ [facility] in [place]; stopped Quetipine  . History of chest pain   . History of dizziness   . History of headache   . Seasonal allergies   . Vision problem    floaters in left eye    Past Surgical History:  Procedure Laterality Date  . Denies surgical hx     Family History:  Family History  Problem Relation Age of Onset  . Asthma Mother   . Depression Mother   . Anxiety disorder Mother   . Bipolar disorder Paternal Aunt    Family Psychiatric  History: unknown Social History:  Social History   Substance and Sexual Activity  Alcohol Use Not Currently   Comment: No use since delivery 04/02/2020     Social History   Substance and Sexual Activity  Drug Use Not Currently  . Frequency: 2.0 times per week  . Types: Marijuana, Cocaine   Comment: No use since delivery 04/02/2020    Social History   Socioeconomic History  . Marital status: Significant Other    Spouse name: Not on file  . Number of children: Not on file  . Years of education: 89  . Highest education level: Not on file  Occupational History  . 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 Smoking status: Current Every Day Smoker    Packs/day: 0.50    Years: 5.00    Pack years: 2.50    Types: Cigarettes  . Smokeless tobacco: Never Used  . Tobacco comment: Cut down since pregnant  Vaping Use  . Vaping Use: Some days  . Last attempt to quit: 07/17/2019  . Substances: Nicotine, Flavoring  Substance and Sexual Activity  . Alcohol use: Not Currently    Comment: No use since delivery 04/02/2020  . Drug use: Not Currently    Frequency: 2.0 times per week    Types: Marijuana, Cocaine    Comment: No use since delivery 04/02/2020   . Sexual activity: Yes    Birth control/protection: Injection, Condom    Comment: Nexplanon removed several months ago (2019).  Other Topics Concern  . Not on file  Social History Narrative  . Not on file   Social Determinants of Health   Financial Resource Strain: Medium Risk  . Difficulty of Paying Living Expenses: Somewhat hard  Food Insecurity: Food Insecurity Present  . Worried About Charity fundraiser in the Last Year: Sometimes true  . Ran Out of Food in the Last Year: Sometimes true  Transportation Needs: No Transportation Needs  . Lack of Transportation (Medical): No  . Lack of Transportation (Non-Medical): No  Physical Activity:   . Days of Exercise per Week:   . Minutes of Exercise per Session:   Stress: Stress Concern Present  . Feeling of Stress : To some extent  Social Connections:   . Frequency of Communication with Friends and Family:   . Frequency of Social Gatherings with Friends and Family:   . Attends Religious Services:   . Active Member of Clubs or Organizations:   . Attends Archivist Meetings:   Marland Kitchen Marital Status:    Additional Social History:    Allergies:  No Known Allergies  Labs:  Results for orders placed or performed during the hospital encounter of 06/28/20 (from the past 48 hour(s))  Comprehensive metabolic panel     Status: Abnormal   Collection Time: 06/28/20  8:01 PM  Result Value Ref Range   Sodium 140 135 - 145 mmol/L   Potassium 3.4 (L) 3.5 - 5.1 mmol/L   Chloride 104 98 - 111 mmol/L   CO2 22 22 - 32 mmol/L   Glucose, Bld 102 (H) 70 - 99 mg/dL    Comment: Glucose reference range applies only to samples taken after fasting for at least 8 hours.   BUN 16 6 - 20 mg/dL   Creatinine, Ser 0.77 0.44 - 1.00 mg/dL   Calcium 9.2 8.9 - 10.3 mg/dL   Total Protein 7.8 6.5 - 8.1 g/dL   Albumin 4.6 3.5 - 5.0 g/dL   AST 32 15 - 41 U/L   ALT 84 (H) 0 - 44 U/L   Alkaline Phosphatase 78 38 - 126 U/L   Total Bilirubin 0.9 0.3 - 1.2  mg/dL   GFR calc non Af Amer >60 >60 mL/min   GFR calc Af Amer >60 >60 mL/min   Anion gap 14 5 - 15    Comment: Performed at [facility], [address] [place], [place] [address]  Ethanol     Status: None   Collection Time: 06/28/20  8:01 PM  Result Value Ref Range   Alcohol, Ethyl (B) <10 <10 mg/dL    Comment: (NOTE) Lowest detectable limit for serum alcohol is 10 mg/dL.  For medical purposes only. Performed at [facility], [address] [place], [place] [address]   Salicylate level     Status: Abnormal   Collection Time: 06/28/20  8:01 PM  Result Value Ref Range   Salicylate Lvl <9.2 (L) 7.0 - 30.0 mg/dL    Comment: Performed at [facility], [address] [place], [place] [address]  Acetaminophen level     Status: Abnormal   Collection Time: 06/28/20  8:01 PM  Result Value Ref Range   Acetaminophen (Tylenol), Serum <10 (L) 10 - 30 ug/mL    Comment: (NOTE) Therapeutic concentrations vary significantly. A range of 10-30 ug/mL  may be an effective concentration for many patients. However, some  are best treated at concentrations outside of this range. Acetaminophen concentrations >150 ug/mL at 4 hours after ingestion  and >50 ug/mL at 12 hours after ingestion are often associated with  toxic reactions.  Performed at [facility], [address] [place], [place] [address]   cbc     Status: Abnormal   Collection Time: 06/28/20  8:01 PM  Result Value Ref Range   WBC 9.2 4.0 - 10.5 K/uL   RBC 4.50 3.87 - 5.11 MIL/uL   Hemoglobin 13.1 12.0 - 15.0 g/dL   HCT 38.4 36 - 46 %   MCV 85.3 80.0 - 100.0 fL   MCH 29.1 26.0 - 34.0 pg   MCHC 34.1 30.0 - 36.0 g/dL   RDW 13.3 11.5 - 15.5 %   Platelets 437 (H) 150 - 400  K/uL   nRBC 0.0 0.0 - 0.2 %    Comment: Performed at [facility], [address] [place], [place] [address]    Current Facility-Administered Medications  Medication Dose Route Frequency Provider Last Rate  Last Admin  . [START ON 07/01/2020] medroxyPROGESTERone (DEPO-PROVERA) injection 150 mg  150 mg Intramuscular Q90 days Caren Macadam, MD       Current Outpatient Medications  Medication Sig Dispense Refill  . QUEtiapine (SEROQUEL) 300 MG tablet Take 1 tablet (300 mg total) by mouth at bedtime. 30 tablet 2  . ibuprofen (ADVIL) 800 MG tablet Take 1 tablet (800 mg total) by mouth every 8 (eight) hours as needed. (Patient not taking: Reported on 06/28/2020) 30 tablet 0  . Multiple Vitamins-Minerals (MULTIVITAMIN WITH MINERALS) tablet Take 1 tablet by mouth daily. (Patient not taking: Reported on 06/28/2020)    . NARCAN 4 MG/0.1ML LIQD nasal spray kit Place 4 mg into the nose once.    . Prenatal Vit-Fe Fumarate-FA (PRENATAL VITAMIN) 27-0.8 MG TABS Take 1 tablet by mouth daily at 6 (six) AM. (Patient not taking: Reported on 04/01/2020) 100 tablet 0    Musculoskeletal: Strength & Muscle Tone: within normal limits Gait & Station: normal Patient leans: N/A  Psychiatric Specialty Exam: Physical Exam  Review of Systems  Blood pressure (!) 110/91, pulse (!) 111, temperature 98.5 F (36.9 C), temperature source Oral, resp. rate 20, height '5\' 3"'  (1.6 m), weight 52.6 kg, last menstrual period 06/28/2020, SpO2 98 %, not currently breastfeeding.Body mass index is 20.55 kg/m.  General Appearance: Bizarre  Eye Contact:  Fair  Speech:  Clear and Coherent  Volume:  Normal  Mood:  Anxious and Irritable  Affect:  Congruent  Thought Process:  Coherent and Descriptions of Associations: Intact  Orientation:  Full (Time, Place, and Person)  Thought Content:  WDL  Suicidal Thoughts:  No  Homicidal Thoughts:  No  Memory:  Recent;   Fair  Judgement:  Fair  Insight:  Fair  Psychomotor Activity:  Normal  Concentration:  Attention Span: Fair  Recall:  [employer] of Knowledge:  Fair  Language:  Fair  Akathisia:  NA  Handed:  Right  AIMS (if indicated):     Assets:  Communication Skills  ADL's:  Intact   Cognition:  WNL  Sleep:       Disposition: Reasses in the am and dicharge if psychiatrically stable, preferabley after a UDS is obtained   Deloria Lair, NP 06/29/2020 5:55 AM

## 2020-06-29 NOTE — Progress Notes (Signed)
Patient ID: Linda Chapman, female   DOB: [birth year], 19 y.o.   MRN: [id number]  D: Patient is newly admitted to [facility]. Patient's skin assessment completed with Megan RN, skin has small "bug bite" to right shin, no contraband found. Patient denies all SI/HI/AVH and anxiety/depression. Patient reports Smoking. Patient reports current stressors of "my baby-daddy IVC'ing me."  A: Patient oriented to unit/room/call light. Patient was offered support and encouragement. Patient was encourage to attend groups, participate in unit activities and continue with plan of care. Q x 15 minute observation checks were completed for safety.   R: Patient has no complaints at this time. Patient is receptive to treatment and safety maintained on unit.

## 2020-06-30 DIAGNOSIS — F3162 Bipolar disorder, current episode mixed, moderate: Principal | ICD-10-CM

## 2020-06-30 LAB — LIPID PANEL
Cholesterol: 139 mg/dL (ref 0–200)
HDL: 37 mg/dL — ABNORMAL LOW (ref >40)
LDL Cholesterol: 70 mg/dL (ref 0–99)
Total CHOL/HDL Ratio: 3.8 RATIO
Triglycerides: 158 mg/dL — ABNORMAL HIGH (ref <150)
VLDL: 32 mg/dL (ref 0–40)

## 2020-06-30 MED ORDER — QUETIAPINE FUMARATE 300 MG PO TABS: 300.0000 mg | ORAL_TABLET | Freq: Every day | ORAL | 1 refills | Status: AC

## 2020-06-30 NOTE — Progress Notes (Signed)
  [facility] Adult Case Management Discharge Plan :  Will you be returning to the same living situation after discharge:  Yes,  pt reports I was fine before I came here and I will be fine when I leave here. Pt did not respond to this question.  At discharge, do you have transportation home?: Yes,  "my baby daddy" Do you have the ability to pay for your medications: Yes,  pt reports being fine  Release of information consent forms not completed, pt refused.   Patient to Follow up at: Pt declined any follow up.   Next level of care provider has access to [facility] Link:no  Safety Planning and Suicide Prevention discussed: Pt refused consent to speak with a family member. Pt given SPE brochure at discharge.   Have you used any form of tobacco in the last 30 days? (Cigarettes, Smokeless Tobacco, Cigars, and/or Pipes): Yes  Has patient been referred to the Quitline?: Patient refused referral  Patient has been referred for addiction treatment: Pt. refused referral  Shellia Cleverly, LCSW 06/30/2020, 2:42 PM

## 2020-06-30 NOTE — [facility] Suicide Risk Assessment (Signed)
[facility] INPATIENT:  Family/Significant Other Suicide Prevention Education  Suicide Prevention Education:  Patient Refusal for Family/Significant Other Suicide Prevention Education: The patient Linda Chapman has refused to provide written consent for family/significant other to be provided Family/Significant Other Suicide Prevention Education during admission and/or prior to discharge.  Physician notified.  Shellia Cleverly 06/30/2020

## 2020-06-30 NOTE — Plan of Care (Signed)
  Problem: Education: Goal: Knowledge of [facility] Education information/materials will improve Outcome: Progressing Goal: Emotional status will improve Outcome: Progressing Goal: Mental status will improve Outcome: Progressing Goal: Verbalization of understanding the information provided will improve Outcome: Progressing   

## 2020-06-30 NOTE — Progress Notes (Signed)
Patient denies SI/HI, denies A/V hallucinations. Patient verbalizes understanding of discharge instructions, follow up care and prescriptions. Patient given all belongings from [facility] locker. Patient escorted out by staff, transported by friend. 

## 2020-06-30 NOTE — [facility] Suicide Risk Assessment (Signed)
[facility] Admission Suicide Risk Assessment   Nursing information obtained from:  Patient Demographic factors:  NA Current Mental Status:  NA Loss Factors:  NA Historical Factors:  Impulsivity Risk Reduction Factors:  Responsible for children under 20 years of age  Total Time spent with patient: 1 hour Principal Problem: Bipolar mixed affective disorder, moderate (HCC) Diagnosis:  Principal Problem:   Bipolar mixed affective disorder, moderate (HCC) Active Problems:   Cocaine abuse (HCC)  Subjective Data: Patient seen and chart reviewed.  19 year old woman with a history of substance abuse and mood instability brought in under IVC filed by ex-boyfriend.  Paperwork alleged that she had made suicidal statements.  Patient has consistently denied making suicidal statements and denied any suicidal ideation throughout her time in the emergency room for the last couple days.  She admits to recent relapse into substance abuse.  Denies acute depression.  Denies suicidal thoughts denies psychosis.  States that she is compliant with her outpatient treatment.  Continued Clinical Symptoms:  Alcohol Use Disorder Identification Test Final Score (AUDIT): 0 The "Alcohol Use Disorders Identification Test", Guidelines for Use in Primary Care, Second Edition.  World Science writer [facility]). Score between 0-7:  no or low risk or alcohol related problems. Score between 8-15:  moderate risk of alcohol related problems. Score between 16-19:  high risk of alcohol related problems. Score 20 or above:  warrants further diagnostic evaluation for alcohol dependence and treatment.   CLINICAL FACTORS:   Bipolar Disorder:   Mixed State Alcohol/Substance Abuse/Dependencies   Musculoskeletal: Strength & Muscle Tone: within normal limits Gait & Station: normal Patient leans: Backward  Psychiatric Specialty Exam: Physical Exam Vitals and nursing note reviewed.  Constitutional:      Appearance: She is  well-developed.  HENT:     Head: Normocephalic and atraumatic.  Eyes:     Conjunctiva/sclera: Conjunctivae normal.     Pupils: Pupils are equal, round, and reactive to light.  Cardiovascular:     Heart sounds: Normal heart sounds.  Pulmonary:     Effort: Pulmonary effort is normal.  Abdominal:     Palpations: Abdomen is soft.  Musculoskeletal:        General: Normal range of motion.     Cervical back: Normal range of motion.  Skin:    General: Skin is warm and dry.  Neurological:     General: No focal deficit present.     Mental Status: She is alert.  Psychiatric:        Attention and Perception: Attention normal.        Mood and Affect: Mood is anxious.        Speech: Speech normal.        Behavior: Behavior is cooperative.        Thought Content: Thought content normal.        Cognition and Memory: Memory is impaired.        Judgment: Judgment is impulsive.     Review of Systems  Constitutional: Negative.   HENT: Negative.   Eyes: Negative.   Respiratory: Negative.   Cardiovascular: Negative.   Gastrointestinal: Negative.   Musculoskeletal: Negative.   Skin: Negative.   Neurological: Negative.   Psychiatric/Behavioral: Positive for dysphoric mood. Negative for hallucinations, sleep disturbance and suicidal ideas. The patient is nervous/anxious. The patient is not hyperactive.     Blood pressure (!) 81/52, pulse 83, temperature 98.4 F (36.9 C), temperature source Oral, resp. rate 18, height 5\' 3"  (1.6 m), weight 52 kg, last menstrual  period 06/28/2020, SpO2 99 %, not currently breastfeeding.Body mass index is 20.31 kg/m.  General Appearance: Casual  Eye Contact:  Fair  Speech:  Slow  Volume:  Decreased  Mood:  Irritable  Affect:  Congruent  Thought Process:  Coherent  Orientation:  Full (Time, Place, and Person)  Thought Content:  Logical  Suicidal Thoughts:  No  Homicidal Thoughts:  No  Memory:  Immediate;   Fair Recent;   Fair Remote;   Fair  Judgement:   Fair  Insight:  Fair  Psychomotor Activity:  Normal  Concentration:  Concentration: Fair  Recall:  [employer] of Knowledge:  Fair  Language:  Fair  Akathisia:  No  Handed:  Right  AIMS (if indicated):     Assets:  Desire for Improvement Housing Physical Health Resilience  ADL's:  Intact  Cognition:  WNL  Sleep:  Number of Hours: 7.25      COGNITIVE FEATURES THAT CONTRIBUTE TO RISK:  None    SUICIDE RISK:   Minimal: No identifiable suicidal ideation.  Patients presenting with no risk factors but with morbid ruminations; may be classified as minimal risk based on the severity of the depressive symptoms  PLAN OF CARE: Patient currently is not showing indication of being suicidal.  Affect reactive and appropriate to the situation.  Thoughts lucid.  Does not meet commitment criteria.  Counseling provided as well as review of outpatient treatment.  Likely discharge today.  I certify that inpatient services furnished can reasonably be expected to improve the patient's condition.   Mordecai Rasmussen, MD 06/30/2020, 12:39 PM

## 2020-06-30 NOTE — Discharge Summary (Signed)
Physician Discharge Summary Note  Patient:  Linda Chapman is an 19 y.o., female MRN:  [id number] DOB:  [birth year] Patient phone:  [phone] (home)  Patient address:   [place] [address],  Total Time spent with patient: 1 hour  Date of Admission:  06/29/2020 Date of Discharge: 06/30/2020  Reason for Admission: Admitted through the emergency room where she presented under involuntary commitment papers filed by her ex-boyfriend.  Allegations that she had made suicidal ideation.  Principal Problem: Bipolar mixed affective disorder, moderate ([place]) Discharge Diagnoses: Principal Problem:   Bipolar mixed affective disorder, moderate ([facility]) Active Problems:   Cocaine abuse ([place])   Past Psychiatric History: Past history of mood instability bipolar disorder diagnosis and history of polysubstance abuse.  Past history of suicide attempts.  Currently involved in outpatient mental health treatment  Past Medical History:  Past Medical History:  Diagnosis Date  . Anxiety    dg @ 13  . Asthma    Seasonal allergies --no inhaler since child  . Bipolar 1 disorder ([place])   . Depression    Dg in pas--currently sees therapist @ [facility] in [place]; stopped Quetipine  . History of chest pain   . History of dizziness   . History of headache   . Seasonal allergies   . Vision problem    floaters in left eye    Past Surgical History:  Procedure Laterality Date  . Denies surgical hx     Family History:  Family History  Problem Relation Age of Onset  . Asthma Mother   . Depression Mother   . Anxiety disorder Mother   . Bipolar disorder Paternal Aunt    Family Psychiatric  History: See previous.  None reported Social History:  Social History   Substance and Sexual Activity  Alcohol Use Not Currently   Comment: No use since delivery 04/02/2020     Social History   Substance and Sexual Activity  Drug Use Not Currently  . Frequency: 2.0 times per week   . Types: Marijuana, Cocaine   Comment: No use since delivery 04/02/2020    Social History   Socioeconomic History  . Marital status: Significant Other    Spouse name: Not on file  . Number of children: Not on file  . Years of education: 29  . Highest education level: Not on file  Occupational History  . 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 Smoking status: Current Every Day Smoker    Packs/day: 0.50    Years: 5.00    Pack years: 2.50    Types: Cigarettes  . Smokeless tobacco: Never Used  . Tobacco comment: Cut down since pregnant  Vaping Use  . Vaping Use: Some days  . Last attempt to quit: 07/17/2019  . Substances: Nicotine, Flavoring  Substance and Sexual Activity  . Alcohol use: Not Currently    Comment: No use since delivery 04/02/2020  . Drug use: Not Currently    Frequency: 2.0 times per week    Types: Marijuana, Cocaine    Comment: No use since delivery 04/02/2020  . Sexual activity: Yes    Birth control/protection: Injection, Condom    Comment: Nexplanon removed several months ago (2019).  Other Topics Concern  . Not on file  Social History Narrative  . Not on file   Social Determinants of Health   Financial Resource Strain: Medium Risk  . Difficulty of Paying Living  Expenses: Somewhat hard  Food Insecurity: Food Insecurity Present  . Worried About Charity fundraiser in the Last Year: Sometimes true  . Ran Out of Food in the Last Year: Sometimes true  Transportation Needs: No Transportation Needs  . Lack of Transportation (Medical): No  . Lack of Transportation (Non-Medical): No  Physical Activity:   . Days of Exercise per Week:   . Minutes of Exercise per Session:   Stress: Stress Concern Present  . Feeling of Stress : To some extent  Social Connections:   . Frequency of Communication with Friends and Family:   . Frequency of Social Gatherings with Friends and Family:   . Attends Religious Services:    . Active Member of Clubs or Organizations:   . Attends Archivist Meetings:   Marland Kitchen Marital Status:     Hospital Course: Patient admitted to psychiatric unit.  15-minute checks maintained.  Patient showed no dangerous behavior in the hospital.  She was calm and cooperative with treatment.  She was lucid in her interview and denied any suicidal or homicidal ideation and reported that she was motivated to continue with her outpatient treatment in order to regain custody of her baby.  No sign of acute dangerousness no sign of psychosis.  Patient was counseled about the dangers of substance abuse and the importance of maintaining ongoing treatment contacts.  She agreed with this and agreed with continued medication management and therapy.  At this point does not meet commitment criteria does not require further inpatient treatment and will be discharged home.  Physical Findings: AIMS:  , ,  ,  ,    CIWA:    COWS:     Musculoskeletal: Strength & Muscle Tone: within normal limits Gait & Station: normal Patient leans: N/A  Psychiatric Specialty Exam: Physical Exam Vitals and nursing note reviewed.  Constitutional:      Appearance: She is well-developed.  HENT:     Head: Normocephalic and atraumatic.  Eyes:     Conjunctiva/sclera: Conjunctivae normal.     Pupils: Pupils are equal, round, and reactive to light.  Cardiovascular:     Heart sounds: Normal heart sounds.  Pulmonary:     Effort: Pulmonary effort is normal.  Abdominal:     Palpations: Abdomen is soft.  Musculoskeletal:        General: Normal range of motion.     Cervical back: Normal range of motion.  Skin:    General: Skin is warm and dry.  Neurological:     General: No focal deficit present.     Mental Status: She is alert.  Psychiatric:        Attention and Perception: Attention normal.        Mood and Affect: Mood is anxious.        Speech: Speech normal.        Behavior: Behavior is cooperative.         Thought Content: Thought content normal.        Cognition and Memory: Cognition normal.        Judgment: Judgment normal.     Review of Systems  Constitutional: Negative.   HENT: Negative.   Eyes: Negative.   Respiratory: Negative.   Cardiovascular: Negative.   Gastrointestinal: Negative.   Musculoskeletal: Negative.   Skin: Negative.   Neurological: Negative.   Psychiatric/Behavioral: Positive for dysphoric mood. Negative for suicidal ideas.    Blood pressure (!) 81/52, pulse 83, temperature 98.4 F (36.9 C), temperature source  Oral, resp. rate 18, height _0  (1.6 m), weight 52 kg, last menstrual period 06/28/2020, SpO2 99 %, not currently breastfeeding.Body mass index is 20.31 kg/m.  General Appearance: Casual  Eye Contact:  Fair  Speech:  Clear and Coherent  Volume:  Normal  Mood:  Euthymic  Affect:  Congruent  Thought Process:  Goal Directed  Orientation:  Full (Time, Place, and Person)  Thought Content:  Logical  Suicidal Thoughts:  No  Homicidal Thoughts:  No  Memory:  Immediate;   Fair Recent;   Fair Remote;   Fair  Judgement:  Fair  Insight:  Fair  Psychomotor Activity:  Normal  Concentration:  Concentration: Fair  Recall:  [place] of Knowledge:  Fair  Language:  Fair  Akathisia:  No  Handed:  Right  AIMS (if indicated):     Assets:  Desire for Improvement  ADL's:  Intact  Cognition:  WNL  Sleep:  Number of Hours: 7.25     Have you used any form of tobacco in the last 30 days? (Cigarettes, Smokeless Tobacco, Cigars, and/or Pipes): Yes  Has this patient used any form of tobacco in the last 30 days? (Cigarettes, Smokeless Tobacco, Cigars, and/or Pipes) Yes, No  Blood Alcohol level:  Lab Results  Component Value Date   ETH <10 06/28/2020   ETH <10 58/68/2574    Metabolic Disorder Labs:  No results found for: HGBA1C, MPG No results found for: PROLACTIN Lab Results  Component Value Date   CHOL 139 06/30/2020   TRIG 158 (H) 06/30/2020   HDL  37 (L) 06/30/2020   CHOLHDL 3.8 06/30/2020   VLDL 32 06/30/2020   [place] 70 06/30/2020    See Psychiatric Specialty Exam and Suicide Risk Assessment completed by Attending Physician prior to discharge.  Discharge destination:  Home  Is patient on multiple antipsychotic therapies at discharge:  No   Has Patient had three or more failed trials of antipsychotic monotherapy by history:  No  Recommended Plan for Multiple Antipsychotic Therapies: NA  Discharge Instructions    Diet - low sodium heart healthy   Complete by: As directed    Increase activity slowly   Complete by: As directed      Allergies as of 06/30/2020   No Known Allergies     Medication List    STOP taking these medications   ibuprofen 800 MG tablet Commonly known as: ADVIL   multivitamin with minerals tablet   Prenatal Vitamin 27-0.8 MG Tabs     TAKE these medications     Indication  Narcan 4 MG/0.1ML Liqd nasal spray kit Generic drug: naloxone Place 4 mg into the nose once.  Indication: Opioid Overdose   QUEtiapine 300 MG tablet Commonly known as: SEROQUEL Take 1 tablet (300 mg total) by mouth at bedtime.  Indication: Depressive Phase of Manic-Depression        Follow-up recommendations:  Activity:  Activity as tolerated Diet:  Regular diet Other:  Follow-up with outpatient mental health treatment as previously scheduled at home  Comments: Prescription provided at discharge  Signed: Alethia Berthold, MD 06/30/2020, 12:51 PM

## 2020-06-30 NOTE — Progress Notes (Signed)
Patient was on the phone at the beginning of the shift, getting very loud with her child's father. Was redirectable. Denies SI, HI and AVH. Says her child's father IVC'd her after signing his rights away to their child and that she does not need to be here. Calmed down after the phone call ended. No other complaints.

## 2020-06-30 NOTE — [facility] Counselor (Addendum)
CSW attempted to complete PSA with patient. Unable to complete due to symptoms and patient refusal.   2:48PM - PSA not completed due to patient discharge.   Shellia Cleverly, LCSW  06/30/2020 10:30 AM

## 2020-06-30 NOTE — H&P (Signed)
Psychiatric Admission Assessment Adult  Patient Identification: Linda Chapman MRN:  [id number] Date of Evaluation:  06/30/2020 Chief Complaint:  Bipolar mixed affective disorder, moderate ([place]) [F31.62] Principal Diagnosis: Bipolar mixed affective disorder, moderate ([place]) Diagnosis:  Principal Problem:   Bipolar mixed affective disorder, moderate ([place]) Active Problems:   Cocaine abuse ([place])  History of Present Illness: Patient seen and chart reviewed.  19 year old woman with a history of bipolar disorder and substance abuse brought to the emergency room under involuntary commitment papers.  Papers filed by her ex-boyfriend alleging she had made suicidal statements.  Since being in the emergency room and today as well the patient denies any suicidal thought at all and denies having made suicidal statements.  She admits that she has relapsed on 2 drug abuse for a couple of days.  Admits that she got into a contentious exchange on line with her ex-boyfriend whom she refers to as her "baby daddy".  She denies however that she at any point threatened suicide and denies that she was having any thoughts or plans of hurting herself.  Denies any current suicidal ideation.  Says that her mood stays stressed out because of her work and the classes that she has to attend but that nothing else has been any different than usual.  She reports she has been compliant with her usual medication and therapy classes because she is motivated to try to regain custody of her young child.  Patient currently denies any psychotic symptoms any suicidal or homicidal ideation and indicates that she would like to be discharged. Associated Signs/Symptoms: Depression Symptoms:  anxiety, (Hypo) Manic Symptoms:  Irritable Mood, Anxiety Symptoms:  Nonspecific Psychotic Symptoms:  Denies any PTSD Symptoms: Negative Total Time spent with patient: 1 hour  Past Psychiatric History: Patient has a past history of  overdoses with suicidal intent.  Previous hospitalizations last autumn.  Patient has a history of substance abuse with a variety of substances.  Recently says that she has been attending her outpatient classes and taking her medicine.  Denies any recent suicidal behavior or dangerous behavior.  Is the patient at risk to self? No.  Has the patient been a risk to self in the past 6 months? No.  Has the patient been a risk to self within the distant past? Yes.    Is the patient a risk to others? No.  Has the patient been a risk to others in the past 6 months? No.  Has the patient been a risk to others within the distant past? No.   Prior Inpatient Therapy:   Prior Outpatient Therapy:    Alcohol Screening: 1. How often do you have a drink containing alcohol?: Never 2. How many drinks containing alcohol do you have on a typical day when you are drinking?: 1 or 2 3. How often do you have six or more drinks on one occasion?: Never AUDIT-C Score: 0 4. How often during the last year have you found that you were not able to stop drinking once you had started?: Never 5. How often during the last year have you failed to do what was normally expected from you because of drinking?: Never 6. How often during the last year have you needed a first drink in the morning to get yourself going after a heavy drinking session?: Never 7. How often during the last year have you had a feeling of guilt of remorse after drinking?: Never 8. How often during the last year have you been unable  to remember what happened the night before because you had been drinking?: Never 9. Have you or someone else been injured as a result of your drinking?: No 10. Has a relative or friend or a doctor or another health worker been concerned about your drinking or suggested you cut down?: No Alcohol Use Disorder Identification Test Final Score (AUDIT): 0 Alcohol Brief Interventions/Follow-up: AUDIT Score <7 follow-up not  indicated Substance Abuse History in the last 12 months:  Yes.   Consequences of Substance Abuse: Dysphoria and worsening of mood and anxiety with poor sleep Previous Psychotropic Medications: Yes  Psychological Evaluations: Yes  Past Medical History:  Past Medical History:  Diagnosis Date  . Anxiety    dg @ 13  . Asthma    Seasonal allergies --no inhaler since child  . Bipolar 1 disorder ([place])   . Depression    Dg in pas--currently sees therapist @ [facility] in [place]; stopped Quetipine  . History of chest pain   . History of dizziness   . History of headache   . Seasonal allergies   . Vision problem    floaters in left eye    Past Surgical History:  Procedure Laterality Date  . Denies surgical hx     Family History:  Family History  Problem Relation Age of Onset  . Asthma Mother   . Depression Mother   . Anxiety disorder Mother   . Bipolar disorder Paternal Aunt    Family Psychiatric  History: See previous notes.  None reported. Tobacco Screening: Have you used any form of tobacco in the last 30 days? (Cigarettes, Smokeless Tobacco, Cigars, and/or Pipes): Yes Tobacco use, Select all that apply: 5 or more cigarettes per day Are you interested in Tobacco Cessation Medications?: No, patient refused Counseled patient on smoking cessation including recognizing danger situations, developing coping skills and basic information about quitting provided: Refused/Declined practical counseling Social History:  Social History   Substance and Sexual Activity  Alcohol Use Not Currently   Comment: No use since delivery 04/02/2020     Social History   Substance and Sexual Activity  Drug Use Not Currently  . Frequency: 2.0 times per week  . Types: Marijuana, Cocaine   Comment: No use since delivery 04/02/2020    Additional Social History:                           Allergies:  No Known Allergies Lab Results:  Results for orders placed or performed during the hospital  encounter of 06/29/20 (from the past 48 hour(s))  Lipid panel     Status: Abnormal   Collection Time: 06/30/20  8:19 AM  Result Value Ref Range   Cholesterol 139 0 - 200 mg/dL   Triglycerides 158 (H) <150 mg/dL   HDL 37 (L) >40 mg/dL   Total CHOL/HDL Ratio 3.8 RATIO   VLDL 32 0 - 40 mg/dL   LDL Cholesterol 70 0 - 99 mg/dL    Comment:        Total Cholesterol/HDL:CHD Risk Coronary Heart Disease Risk Table                     Men   Women  1/2 Average Risk   3.4   3.3  Average Risk       5.0   4.4  2 X Average Risk   9.6   7.1  3 X Average Risk  23.4   11.0  Use the calculated Patient Ratio above and the CHD Risk Table to determine the patient's CHD Risk.        ATP III CLASSIFICATION (LDL):  <100     mg/dL   Optimal  100-129  mg/dL   Near or Above                    Optimal  130-159  mg/dL   Borderline  160-189  mg/dL   High  >190     mg/dL   Very High Performed at [facility], [address] [place], [place] [address]     Blood Alcohol level:  Lab Results  Component Value Date   [facility] <10 06/28/2020   ETH <10 53/29/9242    Metabolic Disorder Labs:  No results found for: HGBA1C, MPG No results found for: PROLACTIN Lab Results  Component Value Date   CHOL 139 06/30/2020   TRIG 158 (H) 06/30/2020   HDL 37 (L) 06/30/2020   CHOLHDL 3.8 06/30/2020   VLDL 32 06/30/2020   LDLCALC 70 06/30/2020    Current Medications: Current Facility-Administered Medications  Medication Dose Route Frequency Provider Last Rate Last Admin  . acetaminophen (TYLENOL) tablet 650 mg  650 mg Oral Q6H PRN Stavroula Rohde T, MD      . alum & mag hydroxide-simeth (MAALOX/MYLANTA) 200-200-20 MG/5ML suspension 30 mL  30 mL Oral Q4H PRN Jenniferlynn Saad T, MD      . hydrOXYzine (ATARAX/VISTARIL) tablet 50 mg  50 mg Oral TID PRN Kassidee Narciso T, MD      . ibuprofen (ADVIL) tablet 800 mg  800 mg Oral Q8H PRN Kylina Vultaggio T, MD      . magnesium hydroxide (MILK OF MAGNESIA) suspension  30 mL  30 mL Oral Daily PRN Bambi Fehnel T, MD      . multivitamin with minerals tablet 1 tablet  1 tablet Oral Daily Karalyne Nusser, Madie Reno, MD   1 tablet at 06/30/20 0837  . QUEtiapine (SEROQUEL) tablet 300 mg  300 mg Oral QHS Dori Devino, Madie Reno, MD   300 mg at 06/29/20 2110   PTA Medications: Facility-Administered Medications Prior to Admission  Medication Dose Route Frequency Provider Last Rate Last Admin  . [START ON 07/01/2020] medroxyPROGESTERone (DEPO-PROVERA) injection 150 mg  150 mg Intramuscular Q90 days Caren Macadam, MD       Medications Prior to Admission  Medication Sig Dispense Refill Last Dose  . ibuprofen (ADVIL) 800 MG tablet Take 1 tablet (800 mg total) by mouth every 8 (eight) hours as needed. (Patient not taking: Reported on 06/28/2020) 30 tablet 0   . Multiple Vitamins-Minerals (MULTIVITAMIN WITH MINERALS) tablet Take 1 tablet by mouth daily. (Patient not taking: Reported on 06/28/2020)     . NARCAN 4 MG/0.1ML LIQD nasal spray kit Place 4 mg into the nose once.     . Prenatal Vit-Fe Fumarate-FA (PRENATAL VITAMIN) 27-0.8 MG TABS Take 1 tablet by mouth daily at 6 (six) AM. (Patient not taking: Reported on 04/01/2020) 100 tablet 0   . QUEtiapine (SEROQUEL) 300 MG tablet Take 1 tablet (300 mg total) by mouth at bedtime. 30 tablet 2     Musculoskeletal: Strength & Muscle Tone: within normal limits Gait & Station: normal Patient leans: N/A  Psychiatric Specialty Exam: Physical Exam Vitals and nursing note reviewed.  Constitutional:      Appearance: She is well-developed.  HENT:     Head: Normocephalic and atraumatic.  Eyes:     Conjunctiva/sclera:  Conjunctivae normal.     Pupils: Pupils are equal, round, and reactive to light.  Cardiovascular:     Heart sounds: Normal heart sounds.  Pulmonary:     Effort: Pulmonary effort is normal.  Abdominal:     Palpations: Abdomen is soft.  Musculoskeletal:        General: Normal range of motion.     Cervical back: Normal range  of motion.  Skin:    General: Skin is warm and dry.  Neurological:     General: No focal deficit present.     Mental Status: She is alert.  Psychiatric:        Attention and Perception: Attention normal.        Mood and Affect: Affect is blunt.        Speech: Speech normal.        Behavior: Behavior is cooperative.        Thought Content: Thought content normal.        Cognition and Memory: Memory is impaired.        Judgment: Judgment is impulsive.     Review of Systems  Constitutional: Negative.   HENT: Negative.   Eyes: Negative.   Respiratory: Negative.   Cardiovascular: Negative.   Gastrointestinal: Negative.   Musculoskeletal: Negative.   Skin: Negative.   Neurological: Negative.   Psychiatric/Behavioral: Positive for dysphoric mood. The patient is nervous/anxious.     Blood pressure (!) 81/52, pulse 83, temperature 98.4 F (36.9 C), temperature source Oral, resp. rate 18, height _0  (1.6 m), weight 52 kg, last menstrual period 06/28/2020, SpO2 99 %, not currently breastfeeding.Body mass index is 20.31 kg/m.  General Appearance: Casual  Eye Contact:  Fair  Speech:  Slow  Volume:  Decreased  Mood:  Anxious  Affect:  Constricted  Thought Process:  Coherent  Orientation:  Full (Time, Place, and Person)  Thought Content:  Logical  Suicidal Thoughts:  No  Homicidal Thoughts:  No  Memory:  Immediate;   Fair Recent;   Fair Remote;   Fair  Judgement:  Fair  Insight:  Fair  Psychomotor Activity:  Normal  Concentration:  Concentration: Fair  Recall:  [employer] of Knowledge:  Fair  Language:  Fair  Akathisia:  No  Handed:  Right  AIMS (if indicated):     Assets:  Desire for Improvement  ADL's:  Intact  Cognition:  WNL  Sleep:  Number of Hours: 7.25    Treatment Plan Summary: Daily contact with patient to assess and evaluate symptoms and progress in treatment, Medication management and Plan Continue Seroquel.  Review labs.  15-minute checks on the unit.   Patient however at this point no longer meets commitment criteria and may be discharged back to her home with outpatient follow-up  Observation Level/Precautions:  15 minute checks  Laboratory:  Chemistry Profile  Psychotherapy:    Medications:    Consultations:    Discharge Concerns:    Estimated LOS:  Other:     Physician Treatment Plan for Primary Diagnosis: Bipolar mixed affective disorder, moderate ([place]) Long Term Goal(s): Improvement in symptoms so as ready for discharge  Short Term Goals: Ability to demonstrate self-control will improve  Physician Treatment Plan for Secondary Diagnosis: Principal Problem:   Bipolar mixed affective disorder, moderate ([place]) Active Problems:   Cocaine abuse ([place])  Long Term Goal(s): Improvement in symptoms so as ready for discharge  Short Term Goals: Ability to maintain clinical measurements within normal limits will improve  I  certify that inpatient services furnished can reasonably be expected to improve the patient's condition.    Alethia Berthold, MD 7/11/202112:45 PM

## 2020-06-30 NOTE — [facility] Suicide Risk Assessment (Signed)
[facility] Discharge Suicide Risk Assessment   Principal Problem: Bipolar mixed affective disorder, moderate (HCC) Discharge Diagnoses: Principal Problem:   Bipolar mixed affective disorder, moderate (HCC) Active Problems:   Cocaine abuse (HCC)   Total Time spent with patient: 1 hour  Musculoskeletal: Strength & Muscle Tone: within normal limits Gait & Station: normal Patient leans: N/A  Psychiatric Specialty Exam: Review of Systems  Constitutional: Negative.   HENT: Negative.   Eyes: Negative.   Respiratory: Negative.   Cardiovascular: Negative.   Gastrointestinal: Negative.   Musculoskeletal: Negative.   Skin: Negative.   Neurological: Negative.   Psychiatric/Behavioral: Positive for dysphoric mood. The patient is nervous/anxious.     Blood pressure (!) 81/52, pulse 83, temperature 98.4 F (36.9 C), temperature source Oral, resp. rate 18, height 5\' 3"  (1.6 m), weight 52 kg, last menstrual period 06/28/2020, SpO2 99 %, not currently breastfeeding.Body mass index is 20.31 kg/m.  General Appearance: Casual  Eye Contact::  Fair  Speech:  Slow409  Volume:  Decreased  Mood:  Irritable  Affect:  Congruent  Thought Process:  Goal Directed  Orientation:  Full (Time, Place, and Person)  Thought Content:  Logical  Suicidal Thoughts:  No  Homicidal Thoughts:  No  Memory:  Immediate;   Fair Recent;   Fair Remote;   Fair  Judgement:  Fair  Insight:  Fair  Psychomotor Activity:  Normal  Concentration:  Fair  Recall:  [phone] of Knowledge:Fair  Language: Fair  Akathisia:  No  Handed:  Right  AIMS (if indicated):     Assets:  Desire for Improvement Housing Physical Health Resilience  Sleep:  Number of Hours: 7.25  Cognition: WNL  ADL's:  Intact   Mental Status Per Nursing Assessment::   On Admission:  NA  Demographic Factors:  Caucasian, Low socioeconomic status and Living alone  Loss Factors: Legal issues  Historical Factors: Impulsivity  Risk Reduction  Factors:   Responsible for children under 31 years of age, Positive social support and Positive therapeutic relationship  Continued Clinical Symptoms:  Bipolar Disorder:   Mixed State Alcohol/Substance Abuse/Dependencies  Cognitive Features That Contribute To Risk:  None    Suicide Risk:  Minimal: No identifiable suicidal ideation.  Patients presenting with no risk factors but with morbid ruminations; may be classified as minimal risk based on the severity of the depressive symptoms    Plan Of Care/Follow-up recommendations:  Activity:  Activity as tolerated Diet:  Regular diet Other:  Follow-up with outpatient treatment with her usual provider  15, MD 06/30/2020, 12:42 PM

## 2020-06-30 NOTE — B[facility] Group Notes (Signed)
[facility] Group Notes: (Clinical Social Work)   06/30/2020      Type of Therapy:  Group Therapy   Participation Level:  Did Not Attend despite MHT prompting. Pt was getting ready to be discharged.   Shellia Cleverly, [place]  06/30/2020 2:49 PM

## 2020-12-30 IMAGING — CR LEFT HUMERUS - 2+ VIEW
2 series · 2 of 2 positions shown · non-contrast
Comparison: None.

CLINICAL DATA: Fall, left arm injury

EXAM:
LEFT HUMERUS - 2+ VIEW

[humerus ap]
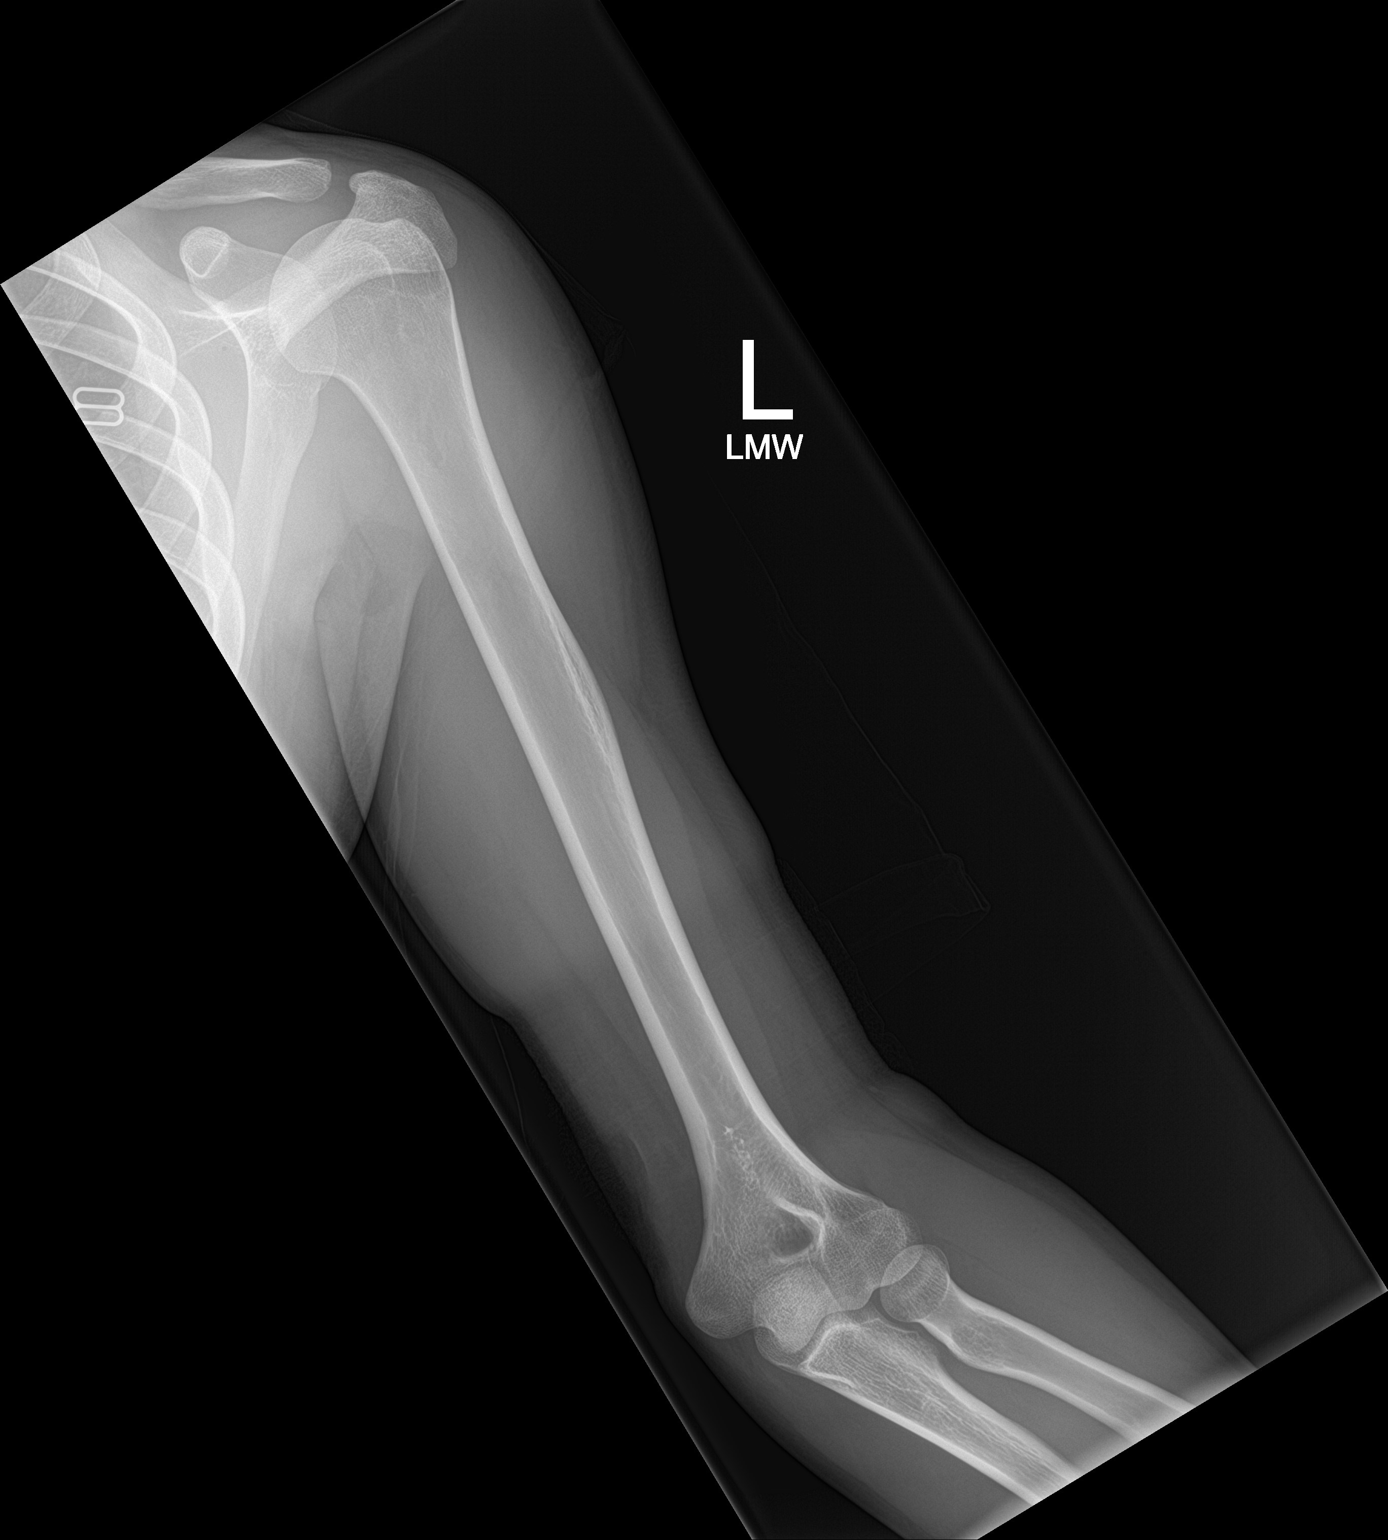

[humerus lat]
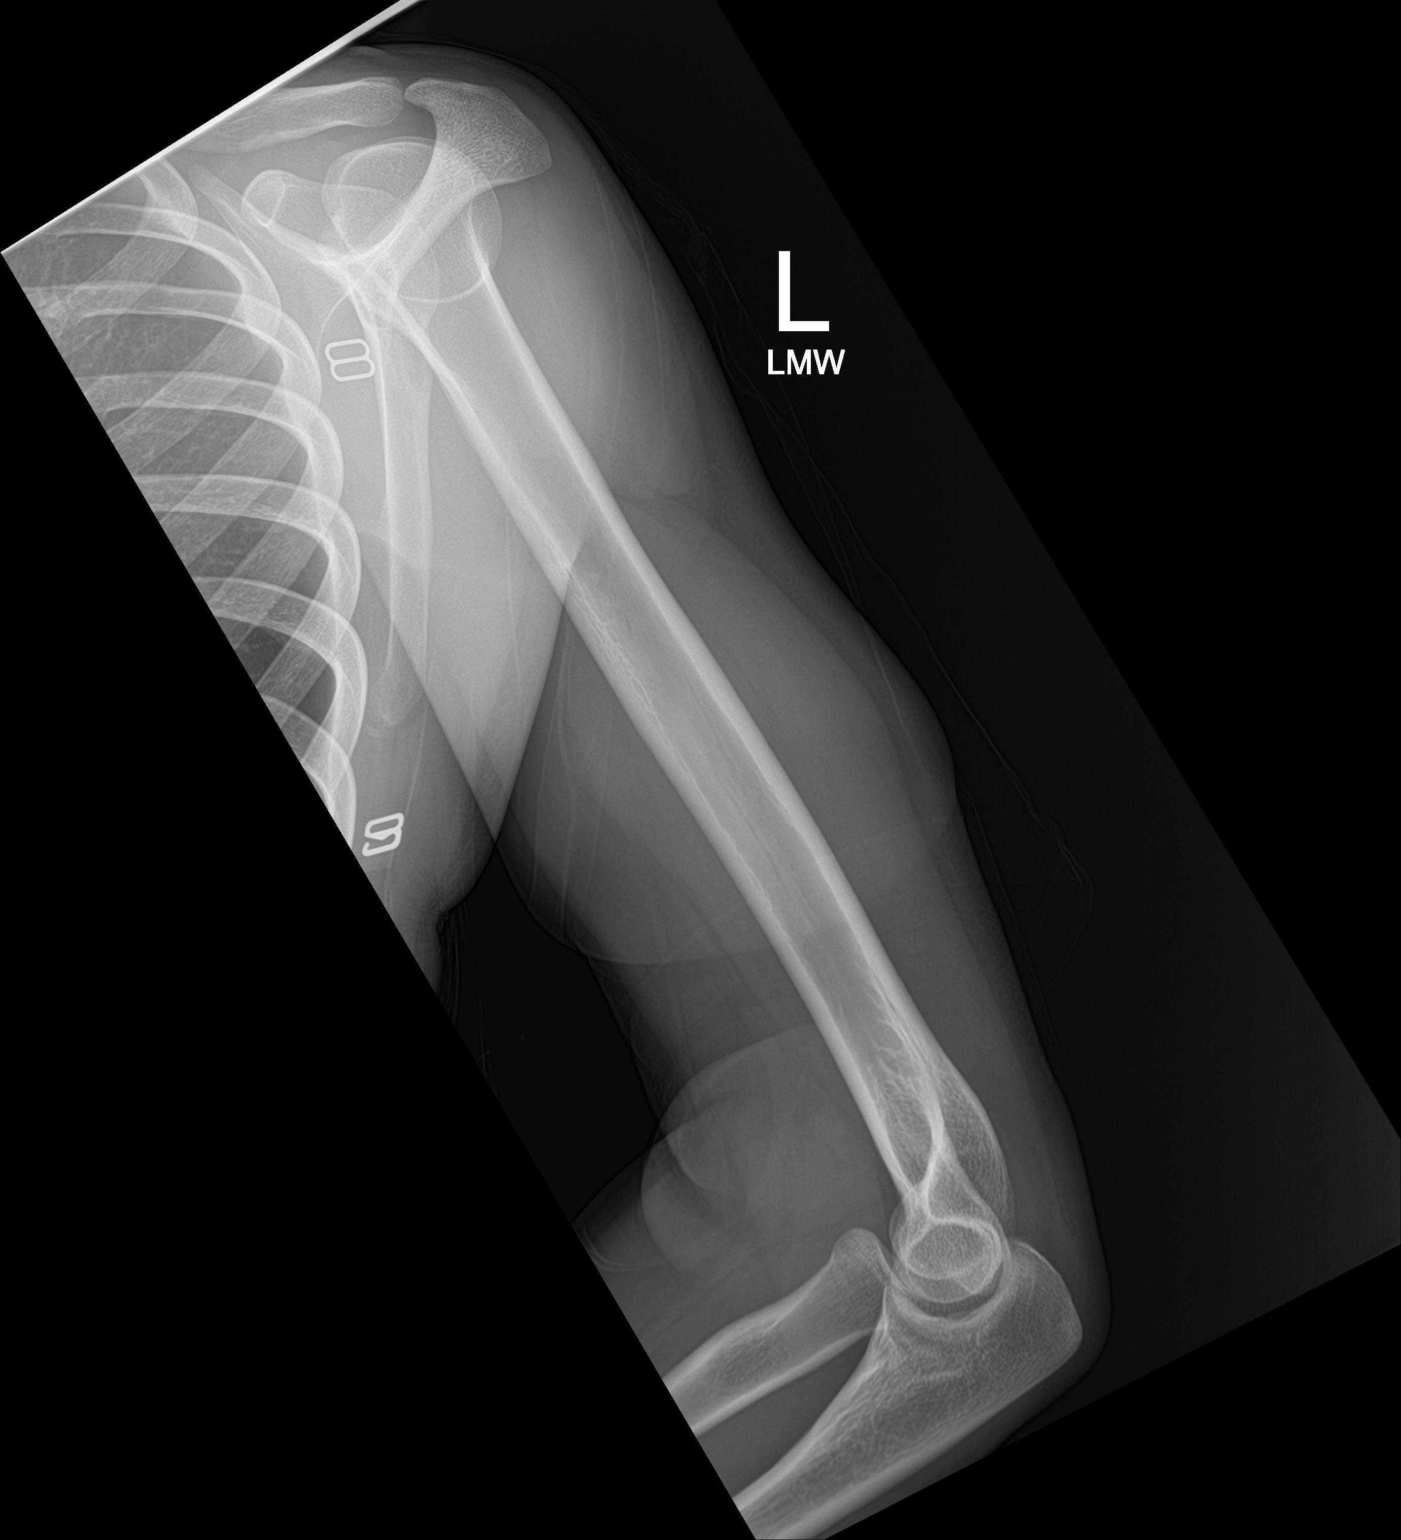

[2 of 2 positions shown; findings below may reference images not displayed]

FINDINGS: There is no evidence of fracture or other focal bone lesions. Soft
tissues are unremarkable.
IMPRESSION: Negative.

## 2022-11-03 ENCOUNTER — Ambulatory Visit: Payer: Medicaid Other | Admitting: Nurse Practitioner

## 2022-11-03 DIAGNOSIS — Z113 Encounter for screening for infections with a predominantly sexual mode of transmission: Secondary | ICD-10-CM | POA: Diagnosis not present

## 2022-11-03 DIAGNOSIS — J45909 Unspecified asthma, uncomplicated: Secondary | ICD-10-CM | POA: Insufficient documentation

## 2022-11-03 LAB — HM HIV SCREENING LAB: HM HIV Screening: NEGATIVE

## 2022-11-03 LAB — HEPATITIS B SURFACE ANTIGEN

## 2022-11-03 LAB — WET PREP FOR TRICH, YEAST, CLUE
Trichomonas Exam: NEGATIVE
Yeast Exam: NEGATIVE

## 2022-11-03 LAB — HM HEPATITIS C SCREENING LAB: HM Hepatitis Screen: NEGATIVE

## 2022-11-03 NOTE — Progress Notes (Signed)
Pt here for STI screening.  Wet mount results reviewed with patient.  No treatment needed as per standing orders.  Condoms declined.-Collins Scotland, RN
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
## 2022-11-05 ENCOUNTER — Telehealth: Payer: Self-pay

## 2022-11-05 NOTE — Telephone Encounter (Signed)
Received phone call from Shawn Stall, DIS. Pt is a contact to new primary stage syphilis case; requests treatment.

## 2022-11-06 ENCOUNTER — Encounter: Payer: Self-pay | Admitting: Nurse Practitioner

## 2022-11-06 ENCOUNTER — Ambulatory Visit: Payer: Medicaid Other | Admitting: Nurse Practitioner

## 2022-11-06 DIAGNOSIS — Z202 Contact with and (suspected) exposure to infections with a predominantly sexual mode of transmission: Secondary | ICD-10-CM | POA: Diagnosis not present

## 2022-11-06 DIAGNOSIS — Z113 Encounter for screening for infections with a predominantly sexual mode of transmission: Secondary | ICD-10-CM

## 2022-11-06 MED ORDER — PENICILLIN G BENZATHINE 1200000 UNIT/2ML IM SUSY
2.4000 10*6.[IU] | PREFILLED_SYRINGE | Freq: Once | INTRAMUSCULAR | Status: AC
  Administered 2022-11-06: 2.4 10*6.[IU] via INTRAMUSCULAR

## 2022-11-06 NOTE — Progress Notes (Signed)
S: 21 year old female in clinic today to be treated as a contact to Syphilis.   Patient had an STD screening on 11/03/22 and at that time she had two lesions noted to her right labia.  O: Alert and oriented x 3   A: Contact to Syphilis  P: Will treat for Syphilis with Bicillin 2.4 mu IM x 1 dose No sex for 14 days and until after partner has completed treatment. Counseled patient re: normal SE of injections and to use OTC analgesics and warm compresses as needed. Patient observed for 15 minutes after injections.  Glenna Fellows, FNP   1. Exposure to syphilis  - penicillin g benzathine (BICILLIN LA) 1200000 UNIT/2ML injection 2.4 Million Units

## 2022-11-06 NOTE — Telephone Encounter (Signed)
Pt seen by [facility] provider on 11/06/22. Pt treated with bicillin 2.4 MU during visit.

## 2022-11-06 NOTE — Progress Notes (Signed)
Pt appointment for contact to syphilis treatment. Seen and treated by FNP Dema Severin and FNP [facility]. Narcan dispensed per pt request after determining the pt met the dispensing criteria. RN provided Narcan education.

## 2022-11-17 ENCOUNTER — Telehealth: Payer: Self-pay

## 2022-11-17 NOTE — Telephone Encounter (Addendum)
Calling pt re positive chlamydia result from 11/03/22 vaginal specimen. Pt needs tx appt.  Phone call to pt at [phone]. Went straight to automated message stating voicemail not set up, unable to leave message. Tried twice.  Sent MyChart message.

## 2022-11-18 ENCOUNTER — Telehealth: Payer: Self-pay | Admitting: Family Medicine

## 2022-11-18 NOTE — Telephone Encounter (Signed)
RN called Pt with available results, and let her know her Syphilis test hasn't resulted yet, but that we would call her if it comes back positive.

## 2022-11-19 NOTE — Telephone Encounter (Addendum)
Phone call to pt re CT and RPR results.  Called [phone], VM not set up, unable to leave message. Called [phone]. Left voicemail message that RN with [facility] is calling re a couple of TR. Please call Zalia Hautala at [phone].  CT postive, needs tx (see previous details).  AND 11/03/22 syphilis results: RPR = Reactive Titer= 1:128 TP= Reactive Pt with symptoms (lesions) at 11/03/22 visit. Pt treated with Bicillin 2.4 MU x 1 on 11/06/22 due to contact to syphilis. Treatment completed on 11/06/22 per [facility] provider written orders on TR (Bicillin x1). *Needs to follow-up in 6 months for bloodwork*

## 2022-11-24 NOTE — Telephone Encounter (Signed)
Phone call to pt at [phone], VM not set up, unable to leave message. Called [phone]. Left voicemail message that RN with [facility] is calling re a couple of TR. Please call Bolivar Koranda at [phone].

## 2022-11-25 NOTE — Telephone Encounter (Addendum)
Mailed certified letter (call [facility] re TR) - see scanned copy. 

## 2024-05-11 NOTE — Progress Notes (Signed)
 05-11-2024 Received a request for medical records from [facility] Department of Adult Corrections with signed authorization for release records on patient Annel Zunker, dob: 2001. Signed authorization allows release of [facility] history and physical, lab reports, office notes, HIV and Communicable Disease and pap smear report from dates of service: 12/2021 to present. Copies of [facility] Dept. medical records for the following faxed to [facility] at the [facility] Dept of Adult [facility] at fax number [phone]. Records released include: 11/06/2022 office note; 11/16-2023 telephone note; 11/17/2022 telephone note; 11/17/2022 patient message note; 11/18/2022 telephone note; 11/03/2022 lab results for HIV and Hepatitis C, RPR, Hepatitis B testing, chlamydia/gonorrhea results, HSV-, HSV-2, VZV, and wet prep results for trich, yeast and clue. See copy of signed authorization scanned in EMR. Cardell Chang RN.

## 2024-05-12 NOTE — Progress Notes (Signed)
 05-12-2024 On 05-11-2024 I received a telephone call from [facility] from the [facility] Dept of Adult [facility] stating she received all 25 pages of medical records I faxed to her for patient Linda Chapman, dob: 2001. Cardell Chang RN.
